# Patient Record
Sex: Female | Born: 1985 | Race: White | Hispanic: Yes | Marital: Married | Smoking: Current every day smoker
Health system: Southern US, Community
[De-identification: ages and names within clinical notes are randomized; demographics above are authoritative.]

## PROBLEM LIST (undated history)

## (undated) DIAGNOSIS — E069 Thyroiditis, unspecified: Secondary | ICD-10-CM

## (undated) DIAGNOSIS — F32A Depression, unspecified: Secondary | ICD-10-CM

## (undated) DIAGNOSIS — F419 Anxiety disorder, unspecified: Secondary | ICD-10-CM

## (undated) DIAGNOSIS — F1520 Other stimulant dependence, uncomplicated: Secondary | ICD-10-CM

## (undated) DIAGNOSIS — O99333 Smoking (tobacco) complicating pregnancy, third trimester: Secondary | ICD-10-CM

## (undated) DIAGNOSIS — R011 Cardiac murmur, unspecified: Secondary | ICD-10-CM

## (undated) DIAGNOSIS — F329 Major depressive disorder, single episode, unspecified: Secondary | ICD-10-CM

## (undated) DIAGNOSIS — D649 Anemia, unspecified: Secondary | ICD-10-CM

## (undated) DIAGNOSIS — E0590 Thyrotoxicosis, unspecified without thyrotoxic crisis or storm: Secondary | ICD-10-CM

## (undated) DIAGNOSIS — E049 Nontoxic goiter, unspecified: Secondary | ICD-10-CM

## (undated) DIAGNOSIS — E079 Disorder of thyroid, unspecified: Secondary | ICD-10-CM

## (undated) DIAGNOSIS — N200 Calculus of kidney: Secondary | ICD-10-CM

## (undated) DIAGNOSIS — E039 Hypothyroidism, unspecified: Secondary | ICD-10-CM

## (undated) HISTORY — DX: Calculus of kidney: N20.0

---

## 2005-01-05 DIAGNOSIS — G43109 Migraine with aura, not intractable, without status migrainosus: Secondary | ICD-10-CM | POA: Insufficient documentation

## 2005-01-05 HISTORY — DX: Migraine with aura, not intractable, without status migrainosus: G43.109

## 2005-01-20 ENCOUNTER — Emergency Department: Payer: Self-pay | Admitting: Internal Medicine

## 2005-02-17 ENCOUNTER — Emergency Department: Payer: Self-pay | Admitting: Internal Medicine

## 2005-02-18 ENCOUNTER — Emergency Department: Payer: Self-pay | Admitting: General Practice

## 2005-03-22 ENCOUNTER — Emergency Department: Payer: Self-pay | Admitting: Emergency Medicine

## 2005-06-20 ENCOUNTER — Emergency Department: Payer: Self-pay | Admitting: Emergency Medicine

## 2006-05-27 ENCOUNTER — Emergency Department: Payer: Self-pay | Admitting: Emergency Medicine

## 2006-05-28 ENCOUNTER — Ambulatory Visit: Payer: Self-pay | Admitting: Emergency Medicine

## 2006-07-01 ENCOUNTER — Inpatient Hospital Stay: Payer: Self-pay | Admitting: Unknown Physician Specialty

## 2006-09-29 ENCOUNTER — Emergency Department: Payer: Self-pay | Admitting: General Practice

## 2007-07-23 ENCOUNTER — Emergency Department: Payer: Self-pay | Admitting: Emergency Medicine

## 2007-11-24 ENCOUNTER — Observation Stay: Payer: Self-pay | Admitting: Obstetrics and Gynecology

## 2008-02-03 ENCOUNTER — Inpatient Hospital Stay: Payer: Self-pay | Admitting: Obstetrics & Gynecology

## 2008-07-30 ENCOUNTER — Emergency Department: Payer: Self-pay | Admitting: Emergency Medicine

## 2008-07-30 ENCOUNTER — Other Ambulatory Visit: Payer: Self-pay

## 2009-02-18 ENCOUNTER — Emergency Department: Payer: Self-pay | Admitting: Emergency Medicine

## 2009-02-23 ENCOUNTER — Emergency Department: Payer: Self-pay | Admitting: Emergency Medicine

## 2009-12-18 ENCOUNTER — Emergency Department: Payer: Self-pay | Admitting: Internal Medicine

## 2010-07-21 ENCOUNTER — Emergency Department: Payer: Self-pay | Admitting: Emergency Medicine

## 2012-03-05 ENCOUNTER — Emergency Department: Payer: Self-pay | Admitting: Emergency Medicine

## 2012-10-09 ENCOUNTER — Emergency Department: Payer: Self-pay | Admitting: Emergency Medicine

## 2012-10-09 LAB — URINALYSIS, COMPLETE
Glucose,UR: NEGATIVE mg/dL (ref 0–75)
Ketone: NEGATIVE
Nitrite: NEGATIVE
Protein: NEGATIVE
RBC,UR: 2 /HPF (ref 0–5)
Specific Gravity: 1.018 (ref 1.003–1.030)
Squamous Epithelial: 5
WBC UR: 1 /HPF (ref 0–5)

## 2013-07-26 ENCOUNTER — Emergency Department: Payer: Self-pay | Admitting: Emergency Medicine

## 2013-07-27 LAB — CBC
HGB: 16.1 g/dL — ABNORMAL HIGH (ref 12.0–16.0)
MCH: 33.8 pg (ref 26.0–34.0)
MCHC: 35.6 g/dL (ref 32.0–36.0)
MCV: 95 fL (ref 80–100)
Platelet: 195 10*3/uL (ref 150–440)
RDW: 13.1 % (ref 11.5–14.5)
WBC: 6.7 10*3/uL (ref 3.6–11.0)

## 2013-07-27 LAB — URINALYSIS, COMPLETE
Bilirubin,UR: NEGATIVE
Blood: NEGATIVE
Glucose,UR: NEGATIVE mg/dL (ref 0–75)
Nitrite: POSITIVE
Ph: 6 (ref 4.5–8.0)
RBC,UR: 1 /HPF (ref 0–5)
Specific Gravity: 1.005 (ref 1.003–1.030)
Squamous Epithelial: 1
WBC UR: 2 /HPF (ref 0–5)

## 2013-07-27 LAB — COMPREHENSIVE METABOLIC PANEL
Albumin: 4.4 g/dL (ref 3.4–5.0)
Anion Gap: 6 — ABNORMAL LOW (ref 7–16)
BUN: 7 mg/dL (ref 7–18)
Bilirubin,Total: 0.5 mg/dL (ref 0.2–1.0)
Calcium, Total: 8.6 mg/dL (ref 8.5–10.1)
Chloride: 107 mmol/L (ref 98–107)
Co2: 28 mmol/L (ref 21–32)
EGFR (African American): >60
EGFR (Non-African Amer.): >60
Glucose: 90 mg/dL (ref 65–99)
Potassium: 3.3 mmol/L — ABNORMAL LOW (ref 3.5–5.1)
SGOT(AST): 25 U/L (ref 15–37)
SGPT (ALT): 18 U/L (ref 12–78)
Total Protein: 8.3 g/dL — ABNORMAL HIGH (ref 6.4–8.2)

## 2013-07-27 LAB — DRUG SCREEN, URINE
Barbiturates, Ur Screen: NEGATIVE (ref ?–200)
Benzodiazepine, Ur Scrn: POSITIVE (ref ?–200)
Cannabinoid 50 Ng, Ur ~~LOC~~: NEGATIVE (ref ?–50)
Methadone, Ur Screen: NEGATIVE (ref ?–300)
Opiate, Ur Screen: NEGATIVE (ref ?–300)

## 2013-07-27 LAB — ETHANOL
Ethanol %: 0.179 % — ABNORMAL HIGH (ref 0.000–0.080)
Ethanol: 179 mg/dL

## 2013-07-27 LAB — SALICYLATE LEVEL: Salicylates, Serum: 3.3 mg/dL — ABNORMAL HIGH

## 2013-08-05 ENCOUNTER — Emergency Department: Payer: Self-pay | Admitting: Emergency Medicine

## 2013-08-05 LAB — DRUG SCREEN, URINE
Amphetamines, Ur Screen: NEGATIVE (ref ?–1000)
Barbiturates, Ur Screen: NEGATIVE (ref ?–200)
Benzodiazepine, Ur Scrn: POSITIVE (ref ?–200)
Cannabinoid 50 Ng, Ur ~~LOC~~: NEGATIVE (ref ?–50)
Cocaine Metabolite,Ur ~~LOC~~: NEGATIVE (ref ?–300)
Opiate, Ur Screen: NEGATIVE (ref ?–300)
Tricyclic, Ur Screen: NEGATIVE (ref ?–1000)

## 2013-08-05 LAB — COMPREHENSIVE METABOLIC PANEL
Albumin: 4.3 g/dL (ref 3.4–5.0)
Alkaline Phosphatase: 76 U/L (ref 50–136)
Anion Gap: 3 — ABNORMAL LOW (ref 7–16)
Calcium, Total: 8.6 mg/dL (ref 8.5–10.1)
Chloride: 110 mmol/L — ABNORMAL HIGH (ref 98–107)
Co2: 27 mmol/L (ref 21–32)
Creatinine: 0.65 mg/dL (ref 0.60–1.30)
EGFR (African American): >60
EGFR (Non-African Amer.): >60
Glucose: 91 mg/dL (ref 65–99)
Potassium: 3.6 mmol/L (ref 3.5–5.1)
SGOT(AST): 22 U/L (ref 15–37)
Sodium: 140 mmol/L (ref 136–145)
Total Protein: 7.9 g/dL (ref 6.4–8.2)

## 2013-08-05 LAB — URINALYSIS, COMPLETE
Bilirubin,UR: NEGATIVE
Glucose,UR: NEGATIVE mg/dL (ref 0–75)
Ph: 5 (ref 4.5–8.0)
Specific Gravity: 1.026 (ref 1.003–1.030)

## 2013-08-05 LAB — CBC
HGB: 16.3 g/dL — ABNORMAL HIGH (ref 12.0–16.0)
MCHC: 35.6 g/dL (ref 32.0–36.0)
Platelet: 157 10*3/uL (ref 150–440)
RBC: 4.84 10*6/uL (ref 3.80–5.20)
RDW: 13.2 % (ref 11.5–14.5)
WBC: 6.1 10*3/uL (ref 3.6–11.0)

## 2013-08-05 LAB — PREGNANCY, URINE: Pregnancy Test, Urine: NEGATIVE m[IU]/mL

## 2013-08-05 LAB — TSH: Thyroid Stimulating Horm: 0.28 u[IU]/mL — ABNORMAL LOW

## 2013-08-31 ENCOUNTER — Emergency Department: Payer: Self-pay | Admitting: Emergency Medicine

## 2014-11-26 ENCOUNTER — Emergency Department: Payer: Self-pay | Admitting: Emergency Medicine

## 2014-11-28 LAB — BETA STREP CULTURE(ARMC)

## 2015-09-07 DIAGNOSIS — F32A Depression, unspecified: Secondary | ICD-10-CM | POA: Insufficient documentation

## 2016-08-09 ENCOUNTER — Encounter: Payer: Self-pay | Admitting: *Deleted

## 2016-08-09 ENCOUNTER — Emergency Department
Admission: EM | Admit: 2016-08-09 | Discharge: 2016-08-09 | Disposition: A | Discharge status: Home / Self Care | Payer: Self-pay | Attending: Emergency Medicine | Admitting: Emergency Medicine

## 2016-08-09 DIAGNOSIS — J029 Acute pharyngitis, unspecified: Secondary | ICD-10-CM | POA: Insufficient documentation

## 2016-08-09 DIAGNOSIS — E0590 Thyrotoxicosis, unspecified without thyrotoxic crisis or storm: Secondary | ICD-10-CM | POA: Insufficient documentation

## 2016-08-09 LAB — COMPREHENSIVE METABOLIC PANEL
ALBUMIN: 3.8 g/dL (ref 3.5–5.0)
ALK PHOS: 72 U/L (ref 38–126)
ALT: 26 U/L (ref 14–54)
ANION GAP: 0 — AB (ref 5–15)
AST: 29 U/L (ref 15–41)
BILIRUBIN TOTAL: 1.1 mg/dL (ref 0.3–1.2)
BUN: 11 mg/dL (ref 6–20)
CALCIUM: 9.2 mg/dL (ref 8.9–10.3)
CO2: 27 mmol/L (ref 22–32)
Chloride: 110 mmol/L (ref 101–111)
GLUCOSE: 101 mg/dL — AB (ref 65–99)
Potassium: 3.8 mmol/L (ref 3.5–5.1)
SODIUM: 137 mmol/L (ref 135–145)
TOTAL PROTEIN: 6.7 g/dL (ref 6.5–8.1)

## 2016-08-09 LAB — CBC WITH DIFFERENTIAL/PLATELET
Basophils Absolute: 0 K/uL (ref 0–0.1)
Basophils Relative: 0 %
Eosinophils Absolute: 0 K/uL (ref 0–0.7)
Eosinophils Relative: 1 %
HCT: 38 % (ref 35.0–47.0)
Hemoglobin: 13.1 g/dL (ref 12.0–16.0)
Lymphocytes Relative: 30 %
Lymphs Abs: 1.6 K/uL (ref 1.0–3.6)
MCH: 28.6 pg (ref 26.0–34.0)
MCHC: 34.5 g/dL (ref 32.0–36.0)
MCV: 83 fL (ref 80.0–100.0)
Monocytes Absolute: 0.4 K/uL (ref 0.2–0.9)
Monocytes Relative: 8 %
Neutro Abs: 3.2 K/uL (ref 1.4–6.5)
Neutrophils Relative %: 61 %
Platelets: 153 K/uL (ref 150–440)
RBC: 4.57 MIL/uL (ref 3.80–5.20)
RDW: 12.7 % (ref 11.5–14.5)
WBC: 5.3 K/uL (ref 3.6–11.0)

## 2016-08-09 LAB — TSH: TSH: <0.01 u[IU]/mL — ABNORMAL LOW (ref 0.350–4.500)

## 2016-08-09 LAB — T4, FREE: FREE T4: 5.2 ng/dL — AB (ref 0.61–1.12)

## 2016-08-09 LAB — MONONUCLEOSIS SCREEN: Mono Screen: NEGATIVE

## 2016-08-09 MED ORDER — PROPRANOLOL HCL 10 MG PO TABS: 10.0000 mg | ORAL_TABLET | Freq: Three times a day (TID) | ORAL | 0 refills | Status: DC

## 2016-08-09 MED ORDER — METHIMAZOLE 5 MG PO TABS: 15.0000 mg | ORAL_TABLET | Freq: Three times a day (TID) | ORAL | 0 refills | Status: DC

## 2016-08-09 MED ORDER — LORAZEPAM 0.5 MG PO TABS
0.5000 mg | ORAL_TABLET | Freq: Once | ORAL | Status: AC
  Administered 2016-08-09: 0.5 mg via ORAL
  Filled 2016-08-09: qty 1

## 2016-08-09 MED ORDER — PROPRANOLOL HCL 10 MG PO TABS
10.0000 mg | ORAL_TABLET | Freq: Once | ORAL | Status: AC
  Administered 2016-08-09: 10 mg via ORAL
  Filled 2016-08-09: qty 1

## 2016-08-09 MED ORDER — SODIUM CHLORIDE 0.9 % IV SOLN
Freq: Once | INTRAVENOUS | Status: AC
Start: 2016-08-09 — End: 2016-08-09
  Administered 2016-08-09: 11:00:00 via INTRAVENOUS

## 2016-08-09 MED ORDER — NICOTINE POLACRILEX 2 MG MT GUM
2.0000 mg | CHEWING_GUM | OROMUCOSAL | Status: DC | PRN
  Administered 2016-08-09: 2 mg via ORAL
  Filled 2016-08-09: qty 1

## 2016-08-09 MED ORDER — KETOROLAC TROMETHAMINE 30 MG/ML IJ SOLN
30.0000 mg | Freq: Once | INTRAMUSCULAR | Status: AC
  Administered 2016-08-09: 30 mg via INTRAVENOUS
  Filled 2016-08-09: qty 1

## 2016-08-09 MED ORDER — DEXAMETHASONE SODIUM PHOSPHATE 10 MG/ML IJ SOLN
10.0000 mg | Freq: Once | INTRAMUSCULAR | Status: AC
  Administered 2016-08-09: 10 mg via INTRAVENOUS
  Filled 2016-08-09: qty 1

## 2016-08-09 MED ORDER — METHIMAZOLE 10 MG PO TABS
10.0000 mg | ORAL_TABLET | Freq: Once | ORAL | Status: AC
  Administered 2016-08-09: 10 mg via ORAL
  Filled 2016-08-09: qty 1

## 2016-08-09 MED ORDER — PREDNISONE 10 MG (21) PO TBPK: ORAL_TABLET | ORAL | 0 refills | Status: DC

## 2016-08-09 NOTE — ED Provider Notes (Signed)
[facility] Emergency Department Provider Note        Time seen: ----------------------------------------- 10:47 AM on 08/09/2016 -----------------------------------------    I have reviewed the triage vital signs and the nursing notes.   HISTORY  Chief Complaint Sore Throat    HPI Vanessa Rivera is a 30 y.o. female who presents to ER for persistent sore throat. Patient states she was seen at [facility] yesterday with a swollen throat and she left. Patient states she was called this morning and told her thyroid levels were abnormal. She complains of continued sore throat and pain with swallowing. She complains of pain in the back of her throat as well as pain in her neck. She denies fevers or chills, just complains of difficulty swallowing and neck pain.   History reviewed. No pertinent past medical history.  There are no active problems to display for this patient.   No past surgical history on file.  Allergies Penicillins  Social History Social History  Substance Use Topics  . Smoking status: Not on file  . Smokeless tobacco: Not on file  . Alcohol use Not on file    Review of Systems Constitutional: Negative for fever. ENT: Positive for sore throat, neck pain Cardiovascular: Negative for chest pain. Respiratory: Negative for shortness of breath. Gastrointestinal: Negative for abdominal pain, vomiting and diarrhea. Genitourinary: Negative for dysuria. Musculoskeletal: Negative for back pain. Skin: Negative for rash. Neurological: Negative for headaches, focal weakness or numbness.  10-point ROS otherwise negative.  ____________________________________________   PHYSICAL EXAM:  VITAL SIGNS: ED Triage Vitals [08/09/16 1003]  Enc Vitals Group     BP (!) 143/83     Pulse Rate (!) 120     Resp 18     Temp 98.5 F (36.9 C)     Temp Source Oral     SpO2 100 %     Weight 130 lb (59 kg)     Height 5\' 3"  (1.6 m)     Head  Circumference      Peak Flow      Pain Score 6     Pain Loc      Pain Edu?      Excl. in GC?     Constitutional: Alert and oriented. Well appearing and in no distress. Eyes: Conjunctivae are normal. PERRL. Normal extraocular movements. ENT   Head: Normocephalic and atraumatic.   Nose: No congestion/rhinnorhea.   Mouth/Throat: Posterior pharyngeal erythema, mild tonsillar hypertrophy   Neck: No stridor.Mild anterior cervical adenopathy, thyromegaly is noted. Cardiovascular: Normal rate, regular rhythm. No murmurs, rubs, or gallops. Respiratory: Normal respiratory effort without tachypnea nor retractions. Breath sounds are clear and equal bilaterally. No wheezes/rales/rhonchi. Gastrointestinal: Soft and nontender. Normal bowel sounds Musculoskeletal: Nontender with normal range of motion in all extremities. No lower extremity tenderness nor edema. Neurologic:  Normal speech and language. No gross focal neurologic deficits are appreciated.  Skin:  Skin is warm, dry and intact. No rash noted. Psychiatric: Mood and affect are normal. Speech and behavior are normal.  ____________________________________________  ED COURSE:  Pertinent labs & imaging results that were available during my care of the patient were reviewed by me and considered in my medical decision making (see chart for details). Clinical Course  Patient resents in no acute distress, we will assess for infectious etiology for sore throat as well as thyroid testing.  Procedures ____________________________________________   LABS (pertinent positives/negatives)  Labs Reviewed  COMPREHENSIVE METABOLIC PANEL - Abnormal; Notable for the following:  Result Value   Glucose, Bld 101 (*)    Creatinine, Ser <0.30 (*)    Anion gap 0 (*)    All other components within normal limits  TSH - Abnormal; Notable for the following:    TSH <0.010 (*)    All other components within normal limits  T4, FREE - Abnormal;  Notable for the following:    Free T4 5.20 (*)    All other components within normal limits  CULTURE, GROUP A STREP ([facility]RC)  CBC WITH DIFFERENTIAL/PLATELET  MONONUCLEOSIS SCREEN  THYROID STIMULATING IMMUNOGLOBULIN  ____________________________________________  FINAL ASSESSMENT AND PLAN  Hyperthyroidism, pharyngitis  Plan: Patient with labs and imaging as dictated above. Unclear etiology for her sore throat, likely viral. Her tests here been unremarkable. We'll place her on steroids to take as an outpatient. She is also found to be hyperthyroid, I discussed with endocrinology and she'll be placed on propranolol and methimazole. I will refer her to endocrinology for close outpatient follow-up.   Emily FilbertWilliams, Jonathan E, MD   Note: This dictation was prepared with Dragon dictation. Any transcriptional errors that result from this process are unintentional    Emily FilbertJonathan E Williams, MD 08/09/16 1310

## 2016-08-09 NOTE — ED Notes (Addendum)
POCT RAPID STREP TEST RESULT NEGATIVE

## 2016-08-09 NOTE — ED Notes (Signed)
Pt. Verbalizes understanding of d/c instructions, prescriptions, and follow-up. VS stable and pain controlled per pt.  Pt. In NAD at time of d/c and denies further concerns regarding this visit. Pt. Ambulatory out of unit with steady gait in good spirits. Pt advised to return to the ED at any time for emergent concerns, or for new/worsening symptoms.

## 2016-08-09 NOTE — ED Triage Notes (Signed)
Pt states she was seen at [facility] yesterday for swollen throat and left, states she was called this AM and told her thyroid levels were off, states throat swelling and pain, pt speaking in full clear sentences in no distress

## 2016-08-12 LAB — CULTURE, GROUP A STREP (THRC)

## 2016-08-16 LAB — THYROID STIMULATING IMMUNOGLOBULIN: THYROID STIMULATING IMMUNOGLOB: 543 % — AB (ref 0–139)

## 2016-10-11 ENCOUNTER — Emergency Department
Admission: EM | Admit: 2016-10-11 | Discharge: 2016-10-11 | Disposition: A | Discharge status: Home / Self Care | Payer: Self-pay | Attending: Emergency Medicine | Admitting: Emergency Medicine

## 2016-10-11 ENCOUNTER — Encounter: Payer: Self-pay | Admitting: Emergency Medicine

## 2016-10-11 DIAGNOSIS — F17200 Nicotine dependence, unspecified, uncomplicated: Secondary | ICD-10-CM | POA: Insufficient documentation

## 2016-10-11 DIAGNOSIS — Z79899 Other long term (current) drug therapy: Secondary | ICD-10-CM | POA: Insufficient documentation

## 2016-10-11 DIAGNOSIS — Z5321 Procedure and treatment not carried out due to patient leaving prior to being seen by health care provider: Secondary | ICD-10-CM | POA: Insufficient documentation

## 2016-10-11 DIAGNOSIS — R590 Localized enlarged lymph nodes: Secondary | ICD-10-CM | POA: Insufficient documentation

## 2016-10-11 MED ORDER — NAPROXEN 500 MG PO TABS: 500.0000 mg | ORAL_TABLET | Freq: Two times a day (BID) | ORAL | 0 refills | Status: DC

## 2016-10-11 NOTE — ED Triage Notes (Signed)
Pt with right sided neck swelling this AM, has been on flagyl for Trichomoniasis x3 days. Pt airway intact, no respiratory distress, normal voice, pt talking on cell phone.

## 2016-10-30 NOTE — ED Provider Notes (Signed)
[facility] Emergency Department Provider Note  ____________________________________________  Time seen: Approximately 8:18 AM  I have reviewed the triage vital signs and the nursing notes.   HISTORY  Chief Complaint Lymphadenopathy    HPI Vanessa Rivera is a 30 y.o. female who presents to the emergency department for evaluation of swelling on the right side of her neck. Symptoms started this morning. She is currently taking Flagyl, but has not otherwise taken any medications. She denies difficulty swallowing or breathing. She denies cough or sore throat.   History reviewed. No pertinent past medical history.  There are no active problems to display for this patient.   History reviewed. No pertinent surgical history.  Prior to Admission medications   Medication Sig Start Date End Date Taking? Authorizing Provider  methimazole (TAPAZOLE) 5 MG tablet Take 3 tablets (15 mg total) by mouth 3 (three) times daily. 08/09/16   Emily FilbertJonathan E Williams, MD  naproxen (NAPROSYN) 500 MG tablet Take 1 tablet (500 mg total) by mouth 2 (two) times daily with a meal. 10/11/16   Nakia Koble B Meosha Castanon, FNP  predniSONE (STERAPRED UNI-PAK 21 TAB) 10 MG (21) TBPK tablet Dispense steroid taper pack as directed 08/09/16   Emily FilbertJonathan E Williams, MD  propranolol (INDERAL) 10 MG tablet Take 1 tablet (10 mg total) by mouth 3 (three) times daily. 08/09/16   Emily FilbertJonathan E Williams, MD    Allergies Penicillins  No family history on file.  Social History Social History  Substance Use Topics  . Smoking status: Current Every Day Smoker  . Smokeless tobacco: Never Used  . Alcohol use No    Review of Systems Constitutional: Negative for fever. Eyes: No visual changes. ENT: Negataive for sore throat; negative for difficulty swallowing. Positive for right side lymph node swelling.  Respiratory: Denies shortness of breath. Gastrointestinal: No abdominal pain.  No nausea, no vomiting.  No  diarrhea.  Musculoskeletal: Negative for generalized body aches. Skin: Negative for rash. Neurological: Negative for headaches, focal weakness or numbness.  ____________________________________________   PHYSICAL EXAM:  VITAL SIGNS: ED Triage Vitals [10/11/16 0816]  Enc Vitals Group     BP (!) 148/76     Pulse Rate (!) 109     Resp 16     Temp 98.2 F (36.8 C)     Temp Source Oral     SpO2 99 %     Weight 130 lb (59 kg)     Height 5\' 3"  (1.6 m)     Head Circumference      Peak Flow      Pain Score 5     Pain Loc      Pain Edu?      Excl. in GC?    Constitutional: Alert and oriented. Well appearing and in no acute distress. Eyes: Conjunctivae are normal. PERRL. EOMI. Head: Atraumatic. Nose: No congestion/rhinnorhea. Mouth/Throat: Mucous membranes are moist.  Oropharynx not edematous, no tonsillar exudate. Uvula midline. Neck: No stridor.  Lymphatic: Lymphadenopathy: Positive, palpable node on the right anterior cervical Cardiovascular: Normal rate, regular rhythm. Good peripheral circulation. Respiratory: Normal respiratory effort. Lungs CTAB. Gastrointestinal: Soft and nontender. Musculoskeletal: No lower extremity tenderness nor edema.   Neurologic:  Normal speech and language. No gross focal neurologic deficits are appreciated. Speech is normal. No gait instability. Skin:  Skin is warm, dry and intact. No rash noted Psychiatric: Mood and affect are normal. Speech and behavior are normal.  ____________________________________________   LABS (all labs ordered are listed, but only  abnormal results are displayed)  Labs Reviewed - No data to display ____________________________________________  EKG  Not indicated ____________________________________________  RADIOLOGY  Not indicated. ____________________________________________   PROCEDURES  Procedure(s) performed: None  Critical Care performed:  No  ____________________________________________   INITIAL IMPRESSION / ASSESSMENT AND PLAN / ED COURSE  Clinical Course     Pertinent labs & imaging results that were available during my care of the patient were reviewed by me and considered in my medical decision making (see chart for details).  Patient encouraged to take the naproxen as prescribed. She is to continue her Flagyl as well. She is to follow up with the PCP for symptoms that do not improve over the next few days or return to the ER if unable to schedule an appointment. ____________________________________________   FINAL CLINICAL IMPRESSION(S) / ED DIAGNOSES  Final diagnoses:  Lymphadenopathy, cervical    Note:  This document was prepared using Dragon voice recognition software and may include unintentional dictation errors.    Chinita PesterCari B Kilo Eshelman, FNP 10/30/16 [id number]    Sharman CheekPhillip Stafford, MD 10/30/16 [phone]

## 2016-10-31 ENCOUNTER — Encounter: Payer: Self-pay | Admitting: Emergency Medicine

## 2016-10-31 ENCOUNTER — Emergency Department
Admission: EM | Admit: 2016-10-31 | Discharge: 2016-11-01 | Disposition: A | Discharge status: Home / Self Care | Payer: Self-pay | Attending: Emergency Medicine | Admitting: Emergency Medicine

## 2016-10-31 DIAGNOSIS — E061 Subacute thyroiditis: Secondary | ICD-10-CM | POA: Insufficient documentation

## 2016-10-31 DIAGNOSIS — Z791 Long term (current) use of non-steroidal anti-inflammatories (NSAID): Secondary | ICD-10-CM | POA: Insufficient documentation

## 2016-10-31 DIAGNOSIS — Z79899 Other long term (current) drug therapy: Secondary | ICD-10-CM | POA: Insufficient documentation

## 2016-10-31 DIAGNOSIS — F17200 Nicotine dependence, unspecified, uncomplicated: Secondary | ICD-10-CM | POA: Insufficient documentation

## 2016-10-31 DIAGNOSIS — E0590 Thyrotoxicosis, unspecified without thyrotoxic crisis or storm: Secondary | ICD-10-CM | POA: Insufficient documentation

## 2016-10-31 DIAGNOSIS — E049 Nontoxic goiter, unspecified: Secondary | ICD-10-CM | POA: Insufficient documentation

## 2016-10-31 LAB — CBC
HCT: 37.6 % (ref 35.0–47.0)
Hemoglobin: 12.9 g/dL (ref 12.0–16.0)
MCH: 28.5 pg (ref 26.0–34.0)
MCHC: 34.4 g/dL (ref 32.0–36.0)
MCV: 82.7 fL (ref 80.0–100.0)
PLATELETS: 144 10*3/uL — AB (ref 150–440)
RBC: 4.54 MIL/uL (ref 3.80–5.20)
RDW: 12.7 % (ref 11.5–14.5)
WBC: 4.5 10*3/uL (ref 3.6–11.0)

## 2016-10-31 MED ORDER — SODIUM CHLORIDE 0.9 % IV BOLUS (SEPSIS)
1000.0000 mL | Freq: Once | INTRAVENOUS | Status: AC
  Administered 2016-10-31: 1000 mL via INTRAVENOUS

## 2016-10-31 NOTE — ED Notes (Signed)
Pt reports she has not taken thyroid med for 2 months due to no money for insurance/medicines.

## 2016-10-31 NOTE — ED Notes (Signed)
Pt has swelling to neck.  Pt has hyperthyroidism and is taking meds.  Pt noticed swelling today.  States neck is tender to touch.  No diff swallowing.  No resp distress.  Pt alert.

## 2016-10-31 NOTE — ED Triage Notes (Signed)
Patient ambulatory to triage with steady gait, without difficulty or distress noted; pt recently seen here & rx tapazole for thyroid but c/o persistent pain; has appt 11/22 for f/u care

## 2016-10-31 NOTE — ED Notes (Signed)
Iv started , fluids infusing

## 2016-11-01 ENCOUNTER — Emergency Department: Payer: Self-pay

## 2016-11-01 LAB — BASIC METABOLIC PANEL
ANION GAP: 5 (ref 5–15)
BUN: 10 mg/dL (ref 6–20)
CHLORIDE: 109 mmol/L (ref 101–111)
CO2: 28 mmol/L (ref 22–32)
Calcium: 9.3 mg/dL (ref 8.9–10.3)
Creatinine, Ser: 0.4 mg/dL — ABNORMAL LOW (ref 0.44–1.00)
GFR calc Af Amer: >60 mL/min (ref >60)
GLUCOSE: 108 mg/dL — AB (ref 65–99)
POTASSIUM: 3.2 mmol/L — AB (ref 3.5–5.1)
Sodium: 142 mmol/L (ref 135–145)

## 2016-11-01 LAB — TSH: TSH: <0.01 u[IU]/mL — ABNORMAL LOW (ref 0.350–4.500)

## 2016-11-01 LAB — T4, FREE: Free T4: 5.35 ng/dL — ABNORMAL HIGH (ref 0.61–1.12)

## 2016-11-01 LAB — POCT PREGNANCY, URINE: PREG TEST UR: NEGATIVE

## 2016-11-01 MED ORDER — IOPAMIDOL (ISOVUE-300) INJECTION 61%
75.0000 mL | Freq: Once | INTRAVENOUS | Status: AC | PRN
  Administered 2016-11-01: 75 mL via INTRAVENOUS

## 2016-11-01 MED ORDER — PREDNISONE 20 MG PO TABS: 40.0000 mg | ORAL_TABLET | Freq: Every day | ORAL | 0 refills | Status: DC

## 2016-11-01 MED ORDER — TRAMADOL HCL 50 MG PO TABS
50.0000 mg | ORAL_TABLET | Freq: Once | ORAL | Status: AC
  Administered 2016-11-01: 50 mg via ORAL
  Filled 2016-11-01: qty 1

## 2016-11-01 MED ORDER — TRAMADOL HCL 50 MG PO TABS: 50.0000 mg | ORAL_TABLET | Freq: Four times a day (QID) | ORAL | 0 refills | Status: DC | PRN

## 2016-11-01 NOTE — Discharge Instructions (Signed)
Please continue to take your medicine and follow up with endocrinology

## 2016-11-01 NOTE — ED Provider Notes (Signed)
[facility] Emergency Department Provider Note   ____________________________________________   First MD Initiated Contact with Patient 10/31/16 2309     (approximate)  I have reviewed the triage vital signs and the nursing notes.   HISTORY  Chief Complaint No chief complaint on file.    HPI Vanessa Rivera is a 30 y.o. female who comes into the hospital today with neck swelling. She reports that she noticed it today. She was given some thyroid medicine 3-4 months ago as she had swelling before. She reports that she took her medication initially but has not taken it since. She reports that she has an appointment on the 22nd in [place] to talk about what to do with the swelling. She reports though that it had been improved. The patient reports that her boyfriend brought her her prescription and she took 3 pills of it today. She came into the hospital because her throat is tender and swollen and it hurts to swallow. The patient reports that she's had occasional shortness of breath on walking up the stairs and feels like her throat may be closing. The patient rates her pain a 7 out of 10 in intensity. She has been taking ibuprofen at home.   History reviewed. No pertinent past medical history.  There are no active problems to display for this patient.   History reviewed. No pertinent surgical history.  Prior to Admission medications   Medication Sig Start Date End Date Taking? Authorizing Provider  methimazole (TAPAZOLE) 5 MG tablet Take 3 tablets (15 mg total) by mouth 3 (three) times daily. 08/09/16  Yes Emily FilbertJonathan E Williams, MD  naproxen (NAPROSYN) 500 MG tablet Take 1 tablet (500 mg total) by mouth 2 (two) times daily with a meal. 10/11/16  Yes Cari B Triplett, FNP  propranolol (INDERAL) 10 MG tablet Take 1 tablet (10 mg total) by mouth 3 (three) times daily. 08/09/16  Yes Emily FilbertJonathan E Williams, MD  predniSONE (DELTASONE) 20 MG tablet Take 2 tablets (40  mg total) by mouth daily. 11/01/16   Rebecka ApleyAllison P Gerline Ratto, MD  traMADol (ULTRAM) 50 MG tablet Take 1 tablet (50 mg total) by mouth every 6 (six) hours as needed. 11/01/16   Rebecka ApleyAllison P Arnola Crittendon, MD    Allergies Penicillins  No family history on file.  Social History Social History  Substance Use Topics  . Smoking status: Current Every Day Smoker  . Smokeless tobacco: Never Used  . Alcohol use No    Review of Systems Constitutional: No fever/chills Eyes: No visual changes. ENT: Throat swelling. Cardiovascular: Denies chest pain. Respiratory:  shortness of breath. Gastrointestinal: No abdominal pain.  No nausea, no vomiting.  No diarrhea.  No constipation. Genitourinary: Negative for dysuria. Musculoskeletal: Negative for back pain. Skin: Negative for rash. Neurological: Negative for headaches, focal weakness or numbness.  10-point ROS otherwise negative.  ____________________________________________   PHYSICAL EXAM:  VITAL SIGNS: ED Triage Vitals  Enc Vitals Group     BP 10/31/16 2136 (!) 150/81     Pulse Rate 11/01/16 0002 86     Resp 10/31/16 2136 20     Temp 10/31/16 2136 98.4 F (36.9 C)     Temp Source 10/31/16 2136 Oral     SpO2 10/31/16 2136 99 %     Weight 10/31/16 2135 130 lb (59 kg)     Height 10/31/16 2135 5\' 3"  (1.6 m)     Head Circumference --      Peak Flow --  Pain Score 10/31/16 2135 6     Pain Loc --      Pain Edu? --      Excl. in GC? --     Constitutional: Alert and oriented. Well appearing and in mild distress. Eyes: Conjunctivae are normal. PERRL. EOMI. Head: Atraumatic. Nose: No congestion/rhinnorhea. Mouth/Throat: Mucous membranes are moist.  Oropharynx non-erythematous. Thyroid swelling and tenderness to palpation Cardiovascular: Normal rate, regular rhythm. Grossly normal heart sounds.  Good peripheral circulation. Respiratory: Normal respiratory effort.  No retractions. Lungs CTAB. Gastrointestinal: Soft and nontender. No  distention. Positive bowel sounds Musculoskeletal: No lower extremity tenderness nor edema.   Neurologic:  Normal speech and language.  Skin:  Skin is warm, dry and intact.  Psychiatric: Mood and affect are normal.   ____________________________________________   LABS (all labs ordered are listed, but only abnormal results are displayed)  Labs Reviewed  CBC - Abnormal; Notable for the following:       Result Value   Platelets 144 (*)    All other components within normal limits  BASIC METABOLIC PANEL - Abnormal; Notable for the following:    Potassium 3.2 (*)    Glucose, Bld 108 (*)    Creatinine, Ser 0.40 (*)    All other components within normal limits  TSH - Abnormal; Notable for the following:    TSH <0.010 (*)    All other components within normal limits  T4, FREE - Abnormal; Notable for the following:    Free T4 5.35 (*)    All other components within normal limits  POC URINE PREG, ED  POCT PREGNANCY, URINE   ____________________________________________  EKG  none ____________________________________________  RADIOLOGY  CT soft tissue neck ____________________________________________   PROCEDURES  Procedure(s) performed: None  Procedures  Critical Care performed: No  ____________________________________________   INITIAL IMPRESSION / ASSESSMENT AND PLAN / ED COURSE  Pertinent labs & imaging results that were available during my care of the patient were reviewed by me and considered in my medical decision making (see chart for details).  Is a 30 year old female who comes into the hospital today with some neck swelling. The patient was diagnosed with a goiter and hyperthyroidism many months ago. She reports though that she has not been taking her medication. Given the symptoms of difficulty swallowing I will perform a CT of her neck looking for obstruction. I will also give the patient dose of tramadol. I will give the patient liter of normal saline and  she will be reassessed.  Clinical Course as of Nov 01 310  Wed Nov 01, 2016  0205 1. Diffusely enlarged and homogeneous thyroid, which likely accounts for patient's anterior neck swelling. Correlation with thyroid function tests recommended. 2. Increased number of lymph nodes within the bilateral neck as well as the visualized upper mediastinum and partially visualized axillae. Findings are nonspecific, and may be reactive in nature (either to an infectious or inflammatory process). Possible lymphoproliferative disorder could also have this appearance. 3. Bilateral palatine and lingual tonsillar hypertrophy with hypertrophy of the adenoidal soft tissues. Similarly, findings may be reactive in nature, either due to an infectious or inflammatory process. Again, lymphoproliferative disorder could be considered. 4. Soft tissue density within the anterior mediastinum, likely residual thymic tissue. Differential considerations remain the same.   CT Soft Tissue Neck W Contrast [AW]    Clinical Course User Index [AW] Rebecka Apley, MD   The patient's CT shows an enlarged thyroid and enlarged lymph nodes. Her TSH is 0.010 and  her free T4 is elevated. I feel that the patient has some thyroiditis. At this time she is not tachycardic nor is she hypertensive. She does have a prescription for methimazole and an appointment with endocrinology. I will discharge the patient with some prednisone as well as tramadol for pain. She does need to follow-up for further evaluation of her thyroid.  ____________________________________________   FINAL CLINICAL IMPRESSION(S) / ED DIAGNOSES  Final diagnoses:  Hyperthyroidism  Goiter  Subacute thyroiditis      NEW MEDICATIONS STARTED DURING THIS VISIT:  Discharge Medication List as of 11/01/2016  2:16 AM    START taking these medications   Details  predniSONE (DELTASONE) 20 MG tablet Take 2 tablets (40 mg total) by mouth daily., Starting Wed  11/01/2016, Print    traMADol (ULTRAM) 50 MG tablet Take 1 tablet (50 mg total) by mouth every 6 (six) hours as needed., Starting Wed 11/01/2016, Print         Note:  This document was prepared using Dragon voice recognition software and may include unintentional dictation errors.    Rebecka ApleyAllison P Kyanne Rials, MD 11/01/16 [phone]

## 2016-11-26 ENCOUNTER — Emergency Department
Admission: EM | Admit: 2016-11-26 | Discharge: 2016-11-26 | Disposition: A | Discharge status: Home / Self Care | Payer: Self-pay | Attending: Emergency Medicine | Admitting: Emergency Medicine

## 2016-11-26 DIAGNOSIS — M545 Low back pain, unspecified: Secondary | ICD-10-CM

## 2016-11-26 DIAGNOSIS — Z79899 Other long term (current) drug therapy: Secondary | ICD-10-CM | POA: Insufficient documentation

## 2016-11-26 DIAGNOSIS — F17200 Nicotine dependence, unspecified, uncomplicated: Secondary | ICD-10-CM | POA: Insufficient documentation

## 2016-11-26 LAB — URINALYSIS, COMPLETE (UACMP) WITH MICROSCOPIC
BACTERIA UA: NONE SEEN
Bilirubin Urine: NEGATIVE
GLUCOSE, UA: NEGATIVE mg/dL
Ketones, ur: NEGATIVE mg/dL
Leukocytes, UA: NEGATIVE
NITRITE: NEGATIVE
PROTEIN: NEGATIVE mg/dL
SPECIFIC GRAVITY, URINE: 1.012 (ref 1.005–1.030)
pH: 6 (ref 5.0–8.0)

## 2016-11-26 LAB — BASIC METABOLIC PANEL
ANION GAP: 5 (ref 5–15)
BUN: 10 mg/dL (ref 6–20)
CHLORIDE: 103 mmol/L (ref 101–111)
CO2: 29 mmol/L (ref 22–32)
Calcium: 9 mg/dL (ref 8.9–10.3)
Creatinine, Ser: 0.57 mg/dL (ref 0.44–1.00)
GFR calc Af Amer: >60 mL/min (ref >60)
Glucose, Bld: 128 mg/dL — ABNORMAL HIGH (ref 65–99)
POTASSIUM: 3.5 mmol/L (ref 3.5–5.1)
SODIUM: 137 mmol/L (ref 135–145)

## 2016-11-26 LAB — CBC
HEMATOCRIT: 43.4 % (ref 35.0–47.0)
HEMOGLOBIN: 15 g/dL (ref 12.0–16.0)
MCH: 28.9 pg (ref 26.0–34.0)
MCHC: 34.5 g/dL (ref 32.0–36.0)
MCV: 83.6 fL (ref 80.0–100.0)
Platelets: 193 10*3/uL (ref 150–440)
RBC: 5.19 MIL/uL (ref 3.80–5.20)
RDW: 13.6 % (ref 11.5–14.5)
WBC: 4.8 10*3/uL (ref 3.6–11.0)

## 2016-11-26 LAB — POCT PREGNANCY, URINE: Preg Test, Ur: NEGATIVE

## 2016-11-26 MED ORDER — KETOROLAC TROMETHAMINE 10 MG PO TABS
10.0000 mg | ORAL_TABLET | Freq: Three times a day (TID) | ORAL | 0 refills | Status: DC | PRN
Start: 2016-11-26 — End: 2017-10-04

## 2016-11-26 NOTE — ED Provider Notes (Signed)
[facility] Emergency Department Provider Note  ____________________________________________  Time seen: Approximately 2:55 PM  I have reviewed the triage vital signs and the nursing notes.   HISTORY  Chief Complaint Flank Pain    HPI Vanessa Rivera is a 30 y.o. female , nonpregnant, without history of renal colic, presenting with left back pain.The patient reports that for the past 3-4 days, she has had an intermittent back pain that is worse with positional changes her laying on it. It feels better if she leans over on her left side. She's had no associated dysuria, hematuria, fever, nausea vomiting or diarrhea. No trauma or unusual exertion.   History reviewed. No pertinent past medical history.  There are no active problems to display for this patient.   History reviewed. No pertinent surgical history.  Current Outpatient Rx  . Order #: [id number] Class: Print  . Order #: [id number] Class: Print  . Order #: [id number] Class: Print  . Order #: [id number] Class: Print    Allergies Penicillins  No family history on file.  Social History Social History  Substance Use Topics  . Smoking status: Current Every Day Smoker  . Smokeless tobacco: Never Used  . Alcohol use No    Review of Systems Constitutional: No fever/chills.No lightheadedness or syncope. Eyes: No visual changes. ENT: No sore throat. No congestion or rhinorrhea. Cardiovascular: Denies chest pain. Denies palpitations. Respiratory: Denies shortness of breath.  No cough. Gastrointestinal: No abdominal pain.  No nausea, no vomiting.  No diarrhea.  No constipation. Genitourinary: Negative for dysuria. Musculoskeletal: Positive left-sided back pain that does not radiate. Skin: Negative for rash. Neurological: Negative for headaches. No focal numbness, tingling or weakness. No urinary or fecal incontinence or retention. No saddle anesthesia.  10-point ROS otherwise  negative.  ____________________________________________   PHYSICAL EXAM:  VITAL SIGNS: ED Triage Vitals  Enc Vitals Group     BP 11/26/16 1124 127/77     Pulse Rate 11/26/16 1124 88     Resp 11/26/16 1124 17     Temp 11/26/16 1124 98.2 F (36.8 C)     Temp Source 11/26/16 1124 Oral     SpO2 11/26/16 1124 100 %     Weight 11/26/16 1124 130 lb (59 kg)     Height 11/26/16 1124 5\' 3"  (1.6 m)     Head Circumference --      Peak Flow --      Pain Score 11/26/16 1125 6     Pain Loc --      Pain Edu? --      Excl. in GC? --     Constitutional: Alert and oriented. Well appearing and in no acute distress. Answers questions appropriately. Eyes: Conjunctivae are normal.  EOMI. No scleral icterus.No eye discharge. Head: Atraumatic. Nose: No congestion/rhinnorhea. Mouth/Throat: Mucous membranes are moist.  Neck: No stridor.  Supple.  No meningismus. Full range of motion without pain. No midline C-spine tenderness to palpation, step-offs or deformities. Cardiovascular: Normal rate, regular rhythm. No murmurs, rubs or gallops.  Respiratory: Normal respiratory effort.  No accessory muscle use or retractions. Lungs CTAB.  No wheezes, rales or ronchi. Gastrointestinal: Soft, nontender and nondistended.  No guarding or rebound.  No peritoneal signs. Musculoskeletal: No LE edema. No midline thoracic or lumbar spine tenderness palpation. Reproducible tenderness to palpation diffusely on the left back laterally. Neurologic:  A&Ox3.  Speech is clear.  Face and smile are symmetric.  EOMI.  Moves all extremities well. Normal gait without ataxia. Normal sensation to  light touch in the lower extremities. Skin:  Skin is warm, dry and intact. No rash noted. Psychiatric: Mood and affect are normal. Speech and behavior are normal.  Normal judgement  ____________________________________________   LABS (all labs ordered are listed, but only abnormal results are displayed)  Labs Reviewed  URINALYSIS,  COMPLETE (UACMP) WITH MICROSCOPIC - Abnormal; Notable for the following:       Result Value   Color, Urine YELLOW (*)    APPearance CLEAR (*)    Hgb urine dipstick SMALL (*)    Squamous Epithelial / LPF 0-5 (*)    All other components within normal limits  BASIC METABOLIC PANEL - Abnormal; Notable for the following:    Glucose, Bld 128 (*)    All other components within normal limits  CBC  POC URINE PREG, ED  POCT PREGNANCY, URINE   ____________________________________________  EKG  Not indicated ____________________________________________  RADIOLOGY  No results found.  ____________________________________________   PROCEDURES  Procedure(s) performed: None  Procedures  Critical Care performed: No ____________________________________________   INITIAL IMPRESSION / ASSESSMENT AND PLAN / ED COURSE  Pertinent labs & imaging results that were available during my care of the patient were reviewed by me and considered in my medical decision making (see chart for details).  30 y.o. female presenting with 3-4 days of intermittent left-sided back pain. Overall, the patient has a reassuring examination which is most consistent with musculoskeletal pain. She has positive hemoglobin in her urine dip but 0-5 red blood cells on the microanalysis; it is much less likely that she has renal colic although I did give her return precautions for this. Her creatinine is normal white blood cell is normal as well. She does not have a UTI. Plan discharge with symptomatic treatment, close PMD follow-up. The patient understands return precautions.  ____________________________________________  FINAL CLINICAL IMPRESSION(S) / ED DIAGNOSES  Final diagnoses:  Acute left-sided low back pain without sciatica    Clinical Course       NEW MEDICATIONS STARTED DURING THIS VISIT:  New Prescriptions   KETOROLAC (TORADOL) 10 MG TABLET    Take 1 tablet (10 mg total) by mouth every 8 (eight)  hours as needed for moderate pain (with food).      Rockne MenghiniAnne-Caroline Aldean Pipe, MD 11/26/16 1459

## 2016-11-26 NOTE — Discharge Instructions (Signed)
You may take Tylenol or Toradol for your pain. Please take Toradol with food. Do not take any other NSAID medications including Motrin, Aleve, ibuprofen, or Advil when you're taking Toradol.  May apply ice or heat to your back for 10 minutes every 2 hours to decrease pain.  Turn to the emergency department if you develop severe pain, blood in your urine, fever, vomiting, or any other symptoms concerning to you.

## 2016-11-26 NOTE — ED Triage Notes (Signed)
Pt c/o left flank pain for the past 3 days with nausea. Denies vomiting or hx of kidney stones in the past, denies painful urination

## 2016-11-26 NOTE — ED Notes (Signed)
Pt placed in room 11. Labs obtained in triage and completed. Pt states she has mid and lower back pain. Denies injury - pain is 6/10. Has not taken any meds for pain today.

## 2017-05-04 ENCOUNTER — Emergency Department
Admission: EM | Admit: 2017-05-04 | Discharge: 2017-05-04 | Disposition: A | Discharge status: Home / Self Care | Payer: Self-pay | Attending: Emergency Medicine | Admitting: Emergency Medicine

## 2017-05-04 ENCOUNTER — Encounter: Payer: Self-pay | Admitting: Emergency Medicine

## 2017-05-04 ENCOUNTER — Emergency Department: Payer: Self-pay

## 2017-05-04 DIAGNOSIS — F17200 Nicotine dependence, unspecified, uncomplicated: Secondary | ICD-10-CM | POA: Insufficient documentation

## 2017-05-04 DIAGNOSIS — Z79899 Other long term (current) drug therapy: Secondary | ICD-10-CM | POA: Insufficient documentation

## 2017-05-04 DIAGNOSIS — R609 Edema, unspecified: Secondary | ICD-10-CM

## 2017-05-04 DIAGNOSIS — E0590 Thyrotoxicosis, unspecified without thyrotoxic crisis or storm: Secondary | ICD-10-CM | POA: Insufficient documentation

## 2017-05-04 DIAGNOSIS — R600 Localized edema: Secondary | ICD-10-CM | POA: Insufficient documentation

## 2017-05-04 HISTORY — DX: Disorder of thyroid, unspecified: E07.9

## 2017-05-04 LAB — URINALYSIS, COMPLETE (UACMP) WITH MICROSCOPIC
BILIRUBIN URINE: NEGATIVE
Glucose, UA: NEGATIVE mg/dL
Hgb urine dipstick: NEGATIVE
KETONES UR: NEGATIVE mg/dL
LEUKOCYTES UA: NEGATIVE
NITRITE: NEGATIVE
PROTEIN: NEGATIVE mg/dL
Specific Gravity, Urine: 1.006 (ref 1.005–1.030)
WBC UA: NONE SEEN WBC/hpf (ref 0–5)
pH: 7 (ref 5.0–8.0)

## 2017-05-04 LAB — COMPREHENSIVE METABOLIC PANEL
ALBUMIN: 3.6 g/dL (ref 3.5–5.0)
ALK PHOS: 73 U/L (ref 38–126)
ALT: 13 U/L — ABNORMAL LOW (ref 14–54)
ANION GAP: 5 (ref 5–15)
AST: 22 U/L (ref 15–41)
BUN: 9 mg/dL (ref 6–20)
CALCIUM: 8.9 mg/dL (ref 8.9–10.3)
CHLORIDE: 107 mmol/L (ref 101–111)
CO2: 26 mmol/L (ref 22–32)
Creatinine, Ser: 0.38 mg/dL — ABNORMAL LOW (ref 0.44–1.00)
GFR calc non Af Amer: >60 mL/min (ref >60)
GLUCOSE: 88 mg/dL (ref 65–99)
Potassium: 3.6 mmol/L (ref 3.5–5.1)
SODIUM: 138 mmol/L (ref 135–145)
Total Bilirubin: 0.4 mg/dL (ref 0.3–1.2)
Total Protein: 6.2 g/dL — ABNORMAL LOW (ref 6.5–8.1)

## 2017-05-04 LAB — CBC WITH DIFFERENTIAL/PLATELET
BASOS PCT: 1 %
Basophils Absolute: 0 10*3/uL (ref 0–0.1)
EOS ABS: 0.1 10*3/uL (ref 0–0.7)
EOS PCT: 1 %
HCT: 33.7 % — ABNORMAL LOW (ref 35.0–47.0)
HEMOGLOBIN: 11.7 g/dL — AB (ref 12.0–16.0)
Lymphocytes Relative: 49 %
Lymphs Abs: 2.3 10*3/uL (ref 1.0–3.6)
MCH: 29.8 pg (ref 26.0–34.0)
MCHC: 34.8 g/dL (ref 32.0–36.0)
MCV: 85.6 fL (ref 80.0–100.0)
Monocytes Absolute: 0.3 10*3/uL (ref 0.2–0.9)
Monocytes Relative: 7 %
NEUTROS PCT: 42 %
Neutro Abs: 2 10*3/uL (ref 1.4–6.5)
PLATELETS: 159 10*3/uL (ref 150–440)
RBC: 3.94 MIL/uL (ref 3.80–5.20)
RDW: 13.2 % (ref 11.5–14.5)
WBC: 4.8 10*3/uL (ref 3.6–11.0)

## 2017-05-04 LAB — PREGNANCY, URINE: PREG TEST UR: NEGATIVE

## 2017-05-04 LAB — T4, FREE: FREE T4: 3.03 ng/dL — AB (ref 0.61–1.12)

## 2017-05-04 LAB — TSH: TSH: 0.015 u[IU]/mL — AB (ref 0.350–4.500)

## 2017-05-04 MED ORDER — IBUPROFEN 800 MG PO TABS
ORAL_TABLET | ORAL | Status: AC
  Administered 2017-05-04: 800 mg via ORAL
  Filled 2017-05-04: qty 1

## 2017-05-04 MED ORDER — METHIMAZOLE 5 MG PO TABS
5.0000 mg | ORAL_TABLET | Freq: Once | ORAL | Status: AC
  Administered 2017-05-04: 5 mg via ORAL
  Filled 2017-05-04: qty 1

## 2017-05-04 MED ORDER — PROPRANOLOL HCL 10 MG PO TABS
10.0000 mg | ORAL_TABLET | Freq: Once | ORAL | Status: AC
  Administered 2017-05-04: 10 mg via ORAL
  Filled 2017-05-04: qty 1

## 2017-05-04 MED ORDER — OXYCODONE-ACETAMINOPHEN 5-325 MG PO TABS
ORAL_TABLET | ORAL | Status: AC
  Administered 2017-05-04: 2 via ORAL
  Filled 2017-05-04: qty 2

## 2017-05-04 MED ORDER — TRAMADOL HCL 50 MG PO TABS: 50.0000 mg | ORAL_TABLET | Freq: Four times a day (QID) | ORAL | 0 refills | Status: DC | PRN

## 2017-05-04 MED ORDER — OXYCODONE-ACETAMINOPHEN 5-325 MG PO TABS
2.0000 | ORAL_TABLET | Freq: Once | ORAL | Status: AC
  Administered 2017-05-04: 2 via ORAL

## 2017-05-04 MED ORDER — METHIMAZOLE 5 MG PO TABS: 5.0000 mg | ORAL_TABLET | Freq: Three times a day (TID) | ORAL | 1 refills | Status: DC

## 2017-05-04 MED ORDER — IBUPROFEN 800 MG PO TABS
800.0000 mg | ORAL_TABLET | Freq: Once | ORAL | Status: AC
  Administered 2017-05-04: 800 mg via ORAL

## 2017-05-04 MED ORDER — PROPRANOLOL HCL 10 MG PO TABS: 10.0000 mg | ORAL_TABLET | Freq: Three times a day (TID) | ORAL | 1 refills | Status: DC

## 2017-05-04 NOTE — ED Provider Notes (Signed)
[facility] Emergency Department Provider Note       Time seen: ----------------------------------------- 6:59 PM on 05/04/2017 -----------------------------------------     I have reviewed the triage vital signs and the nursing notes.   HISTORY   Chief Complaint Leg Swelling    HPI Vanessa Rivera is a 31 y.o. female who presents to the ED for bilateral ankle swelling and pain that started yesterday. Patient states she is also having pain in left ankle. She denies any recent injury or heavy physical activity. She denies fevers, chills, chest pain, shortness of breath, vomiting or diarrhea. She denies any skin discoloration also.   Past Medical History:  Diagnosis Date  . Thyroid disease     There are no active problems to display for this patient.   History reviewed. No pertinent surgical history.  Allergies Penicillins  Social History Social History  Substance Use Topics  . Smoking status: Current Every Day Smoker  . Smokeless tobacco: Never Used  . Alcohol use No    Review of Systems Constitutional: Negative for fever. Eyes: Negative for vision changes ENT:  Negative for congestion, sore throat Cardiovascular: Negative for chest pain. Respiratory: Negative for shortness of breath. Gastrointestinal: Negative for abdominal pain, vomiting and diarrhea. Genitourinary: Negative for dysuria. Musculoskeletal: Positive for bilateral leg pain Skin: Negative for rash. Neurological: Negative for headaches, focal weakness or numbness.  All systems negative/normal/unremarkable except as stated in the HPI  ____________________________________________   PHYSICAL EXAM:  VITAL SIGNS: ED Triage Vitals  Enc Vitals Group     BP 05/04/17 1652 123/70     Pulse Rate 05/04/17 1652 (!) 101     Resp 05/04/17 1652 16     Temp 05/04/17 1652 98.7 F (37.1 C)     Temp Source 05/04/17 1652 Oral     SpO2 05/04/17 1652 99 %     Weight 05/04/17  1652 130 lb (59 kg)     Height --      Head Circumference --      Peak Flow --      Pain Score 05/04/17 1651 7     Pain Loc --      Pain Edu? --      Excl. in GC? --     Constitutional: Alert and oriented. Well appearing and in no distress. Eyes: Conjunctivae are normal. PERRL. Normal extraocular movements. ENT   Head: Normocephalic and atraumatic.   Nose: No congestion/rhinnorhea.   Mouth/Throat: Mucous membranes are moist.   Neck: No stridor. Cardiovascular: Normal rate, regular rhythm. No murmurs, rubs, or gallops. Respiratory: Normal respiratory effort without tachypnea nor retractions. Breath sounds are clear and equal bilaterally. No wheezes/rales/rhonchi. Gastrointestinal: Soft and nontender. Normal bowel sounds Musculoskeletal: Nontender with normal range of motion in extremities. Mild edema on the lower extremities with nonspecific tenderness, worse in the right leg Neurologic:  Normal speech and language. No gross focal neurologic deficits are appreciated.  Skin:  Skin is warm, dry and intact. No rash noted. Psychiatric: Mood and affect are normal. Speech and behavior are normal.  ____________________________________________  ED COURSE:  Pertinent labs & imaging results that were available during my care of the patient were reviewed by me and considered in my medical decision making (see chart for details). Patient presents for leg pain of uncertain etiology, we will assess with labs and imaging as indicated.   Procedures ____________________________________________   LABS (pertinent positives/negatives)  Labs Reviewed  CBC WITH DIFFERENTIAL/PLATELET - Abnormal; Notable for the following:  Result Value   Hemoglobin 11.7 (*)    HCT 33.7 (*)    All other components within normal limits  COMPREHENSIVE METABOLIC PANEL - Abnormal; Notable for the following:    Creatinine, Ser 0.38 (*)    Total Protein 6.2 (*)    ALT 13 (*)    All other components  within normal limits  URINALYSIS, COMPLETE (UACMP) WITH MICROSCOPIC - Abnormal; Notable for the following:    Color, Urine YELLOW (*)    APPearance CLEAR (*)    Bacteria, UA RARE (*)    Squamous Epithelial / LPF 0-5 (*)    All other components within normal limits  TSH - Abnormal; Notable for the following:    TSH 0.015 (*)    All other components within normal limits  T4, FREE - Abnormal; Notable for the following:    Free T4 3.03 (*)    All other components within normal limits  PREGNANCY, URINE    RADIOLOGY Lower extremity venous Doppler  Negative for DVT  ____________________________________________  FINAL ASSESSMENT AND PLAN  Leg pain, edema, Hyperthyroidism  Plan: Patient's labs and imaging were dictated above. Patient had presented for leg pain which may be related to her hyperthyroidism. She has been taken off of her thyroid medication and appears to be hyperthyroid at this time. She was started back on methimazole and propranolol. She has been advised to have close outpatient follow-up with her doctor for recheck. She was also given tramadol for pain in her legs.   Emily Filbert, MD   Note: This note was generated in part or whole with voice recognition software. Voice recognition is usually quite accurate but there are transcription errors that can and very often do occur. I apologize for any typographical errors that were not detected and corrected.     Emily Filbert, MD 05/04/17 2119

## 2017-05-04 NOTE — ED Notes (Signed)
Pt discussed with Dr Mayford KnifeWilliams, verbal order given for labs and Bilateral LE U/S.

## 2017-05-04 NOTE — ED Notes (Signed)
Patient transported to Ultrasound 

## 2017-05-04 NOTE — ED Triage Notes (Signed)
Pt to ED via POV c/o bilateral ankle swelling that started yesterday. Pt states that she is also having pain in the left ankle. Pt denies recent injury to the area. No pitting noted, no redness, or warmth.

## 2017-09-27 ENCOUNTER — Encounter: Payer: Self-pay | Admitting: Emergency Medicine

## 2017-09-27 ENCOUNTER — Emergency Department: Payer: Self-pay

## 2017-09-27 ENCOUNTER — Emergency Department
Admission: EM | Admit: 2017-09-27 | Discharge: 2017-09-27 | Disposition: A | Discharge status: Home / Self Care | Payer: Self-pay | Attending: Emergency Medicine | Admitting: Emergency Medicine

## 2017-09-27 DIAGNOSIS — F17210 Nicotine dependence, cigarettes, uncomplicated: Secondary | ICD-10-CM | POA: Insufficient documentation

## 2017-09-27 DIAGNOSIS — R946 Abnormal results of thyroid function studies: Secondary | ICD-10-CM | POA: Insufficient documentation

## 2017-09-27 DIAGNOSIS — R1310 Dysphagia, unspecified: Secondary | ICD-10-CM | POA: Insufficient documentation

## 2017-09-27 DIAGNOSIS — R591 Generalized enlarged lymph nodes: Secondary | ICD-10-CM | POA: Insufficient documentation

## 2017-09-27 HISTORY — DX: Thyroiditis, unspecified: E06.9

## 2017-09-27 HISTORY — DX: Thyrotoxicosis, unspecified without thyrotoxic crisis or storm: E05.90

## 2017-09-27 HISTORY — DX: Nontoxic goiter, unspecified: E04.9

## 2017-09-27 LAB — CBC WITH DIFFERENTIAL/PLATELET
BASOS ABS: 0 10*3/uL (ref 0–0.1)
BASOS PCT: 0 %
EOS ABS: 0 10*3/uL (ref 0–0.7)
Eosinophils Relative: 0 %
HCT: 36.9 % (ref 35.0–47.0)
HEMOGLOBIN: 12.7 g/dL (ref 12.0–16.0)
Lymphocytes Relative: 33 %
Lymphs Abs: 1.9 10*3/uL (ref 1.0–3.6)
MCH: 28.6 pg (ref 26.0–34.0)
MCHC: 34.3 g/dL (ref 32.0–36.0)
MCV: 83.3 fL (ref 80.0–100.0)
MONOS PCT: 7 %
Monocytes Absolute: 0.4 10*3/uL (ref 0.2–0.9)
NEUTROS ABS: 3.4 10*3/uL (ref 1.4–6.5)
NEUTROS PCT: 60 %
Platelets: 177 10*3/uL (ref 150–440)
RBC: 4.43 MIL/uL (ref 3.80–5.20)
RDW: 13.3 % (ref 11.5–14.5)
WBC: 5.8 10*3/uL (ref 3.6–11.0)

## 2017-09-27 LAB — COMPREHENSIVE METABOLIC PANEL
ALBUMIN: 3.6 g/dL (ref 3.5–5.0)
ALK PHOS: 88 U/L (ref 38–126)
ALT: 19 U/L (ref 14–54)
ANION GAP: 7 (ref 5–15)
AST: 22 U/L (ref 15–41)
BUN: 15 mg/dL (ref 6–20)
CALCIUM: 9.3 mg/dL (ref 8.9–10.3)
CO2: 26 mmol/L (ref 22–32)
Chloride: 106 mmol/L (ref 101–111)
Creatinine, Ser: <0.3 mg/dL — ABNORMAL LOW (ref 0.44–1.00)
Glucose, Bld: 106 mg/dL — ABNORMAL HIGH (ref 65–99)
Potassium: 3.7 mmol/L (ref 3.5–5.1)
SODIUM: 139 mmol/L (ref 135–145)
TOTAL PROTEIN: 6.6 g/dL (ref 6.5–8.1)
Total Bilirubin: 0.7 mg/dL (ref 0.3–1.2)

## 2017-09-27 LAB — POCT PREGNANCY, URINE: PREG TEST UR: NEGATIVE

## 2017-09-27 LAB — TSH: TSH: <0.01 u[IU]/mL — ABNORMAL LOW (ref 0.350–4.500)

## 2017-09-27 LAB — T4, FREE: FREE T4: 4.98 ng/dL — AB (ref 0.61–1.12)

## 2017-09-27 MED ORDER — IOPAMIDOL (ISOVUE-300) INJECTION 61%
75.0000 mL | Freq: Once | INTRAVENOUS | Status: AC | PRN
  Administered 2017-09-27: 75 mL via INTRAVENOUS

## 2017-09-27 MED ORDER — SODIUM CHLORIDE 0.9 % IV BOLUS (SEPSIS)
1000.0000 mL | Freq: Once | INTRAVENOUS | Status: AC
  Administered 2017-09-27: 1000 mL via INTRAVENOUS

## 2017-09-27 NOTE — ED Provider Notes (Addendum)
[facility] Emergency Department Provider Note  ____________________________________________   I have reviewed the triage vital signs and the nursing notes.   HISTORY  Chief Complaint Goiter    HPI Vanessa Rivera is a 31 y.o. female who presents today complaining of her goiter interfering with her swallowing. Patient has a diffuse goiter, known from prior CT and workup in the emergency room however she states that she has not been compliant with her medications for several months has never seen an endocrinologist and has been given medication essentially based upon what she was given in the emergency department during her last visit. She does not report or when that was. Patient states she no she should be compliant but she is not been taking her meds. She would not be here because of her thyroid disease for any other reason except for the fact that she has noticed that the goiter is making it difficult for her to swallow solid foods. It feels "hung up" when she swallows. Patient states that it is also somewhat tender. She does not have any fever or chills. She states it is not usually tender. Patient states that she does not feel more tachycardic or weak than normal and she states that she has had no fevers or significant weight loss.     Past Medical History:  Diagnosis Date  . Goiter   . Hyperthyroidism   . Thyroid disease   . Thyroiditis     There are no active problems to display for this patient.   No past surgical history on file.  Prior to Admission medications   Medication Sig Start Date End Date Taking? Authorizing Provider  ketorolac (TORADOL) 10 MG tablet Take 1 tablet (10 mg total) by mouth every 8 (eight) hours as needed for moderate pain (with food). 11/26/16   Rockne Menghini, MD  methimazole (TAPAZOLE) 5 MG tablet Take 3 tablets (15 mg total) by mouth 3 (three) times daily. 08/09/16   Emily Filbert, MD  methimazole (TAPAZOLE)  5 MG tablet Take 1 tablet (5 mg total) by mouth 3 (three) times daily. 05/04/17   Emily Filbert, MD  predniSONE (DELTASONE) 20 MG tablet Take 2 tablets (40 mg total) by mouth daily. 11/01/16   Rebecka Apley, MD  propranolol (INDERAL) 10 MG tablet Take 1 tablet (10 mg total) by mouth 3 (three) times daily. 08/09/16   Emily Filbert, MD  propranolol (INDERAL) 10 MG tablet Take 1 tablet (10 mg total) by mouth 3 (three) times daily. 05/04/17   Emily Filbert, MD  traMADol (ULTRAM) 50 MG tablet Take 1 tablet (50 mg total) by mouth every 6 (six) hours as needed. 05/04/17 05/04/18  Emily Filbert, MD    Allergies Penicillins  No family history on file.  Social History Social History  Substance Use Topics  . Smoking status: Current Every Day Smoker  . Smokeless tobacco: Never Used  . Alcohol use No    Review of Systems Constitutional: No fever/chills Eyes: No visual changes. ENT: No sore throat. No stiff neck no neck pain Cardiovascular: Denies chest pain. Respiratory: Denies shortness of breath. Gastrointestinal:   no vomiting.  No diarrhea.  No constipation. Genitourinary: Negative for dysuria. Musculoskeletal: Negative lower extremity swelling Skin: Negative for rash. Neurological: Negative for severe headaches, focal weakness or numbness.   ____________________________________________   PHYSICAL EXAM:  VITAL SIGNS: ED Triage Vitals  Enc Vitals Group     BP 09/27/17 1302 119/78  Pulse Rate 09/27/17 1302 (!) 135     Resp 09/27/17 1302 20     Temp 09/27/17 1300 98.4 F (36.9 C)     Temp Source 09/27/17 1300 Oral     SpO2 09/27/17 1302 99 %     Weight 09/27/17 1301 120 lb (54.4 kg)     Height 09/27/17 1301  (1.6 m)     Head Circumference --      Peak Flow --      Pain Score 09/27/17 1300 7     Pain Loc --      Pain Edu? --      Excl. in GC? --     Constitutional: Alert and oriented. Well appearing and in no acute distress. Eyes:  Conjunctivae are normal Head: Atraumatic HEENT: No congestion/rhinnorhea. Mucous membranes are moist.  Oropharynx non-erythematous Neck:   is a clear right-sided principally goiter like mass in the right anterior with mild tenderness but no erythema or fluctuance no masses, no stridor Cardiovascular: slight tachycardia rate 108, regular rhythm. Grossly normal heart sounds.  Good peripheral circulation. Respiratory: Normal respiratory effort.  No retractions. Lungs CTAB. Abdominal: Soft and nontender. No distention. No guarding no rebound Back:  There is no focal tenderness or step off.  there is no midline tenderness there are no lesions noted. there is no CVA tenderness Musculoskeletal: No lower extremity tenderness, no upper extremity tenderness. No joint effusions, no DVT signs strong distal pulses no edema Neurologic:  Normal speech and language. No gross focal neurologic deficits are appreciated.  Skin:  Skin is warm, dry and intact. No rash noted. Psychiatric: Mood and affect are normal. Speech and behavior are normal.  ____________________________________________   LABS (all labs ordered are listed, but only abnormal results are displayed)  Labs Reviewed  COMPREHENSIVE METABOLIC PANEL - Abnormal; Notable for the following:       Result Value   Glucose, Bld 106 (*)    Creatinine, Ser <0.30 (*)    All other components within normal limits  TSH - Abnormal; Notable for the following:    TSH <0.010 (*)    All other components within normal limits  T4, FREE - Abnormal; Notable for the following:    Free T4 4.98 (*)    All other components within normal limits  CBC WITH DIFFERENTIAL/PLATELET  THYROID PANEL WITH TSH  POC URINE PREG, ED    Pertinent labs  results that were available during my care of the patient were reviewed by me and considered in my medical decision making (see chart for details). ____________________________________________  EKG  I personally interpreted any  EKGs ordered by me or triage sinus tach rate 123, no acute ST elevation or depression, normal axis, LVH noted. ____________________________________________  RADIOLOGY  Pertinent labs & imaging results that were available during my care of the patient were reviewed by me and considered in my medical decision making (see chart for details). If possible, patient and/or family made aware of any abnormal findings. ____________________________________________    PROCEDURES  Procedure(s) performed: None  Procedures  Critical Care performed: None  ____________________________________________   INITIAL IMPRESSION / ASSESSMENT AND PLAN / ED COURSE  Pertinent labs & imaging results that were available during my care of the patient were reviewed by me and considered in my medical decision making (see chart for details).  patient here with pain and swelling and difficulty swallowing as a result of a goiter. She has not had any adequate outpatient care she states there  is nothing in the computer that I can find to validate her outpatient care. She is noncompliant in any event. Given that she is now having difficulty swallowing, there was a diffuse goiter with diffuse lymphadenopathy in the past that he fell unfortunately that imaging would be of utility assessment over a year since be imaged. We will obtain CT scan, thyroid tests show very low TSH consistent with her known hyperthyroid and she has free T4 which is very elevated again consistent with prior. She is somewhat tachycardic here, but she states she's been having somewhat decreased by mouth Will give her fluid, she is not confused, don't believe she meets criteria for thyroid storm. She is afebrile etc. However, certainly her uncontrolled thyroid is contributing to her sinus tach. We will obtain EKG as a precaution. She is not anemic. We will evaluate further after CT    ____________________________________________   FINAL CLINICAL  IMPRESSION(S) / ED DIAGNOSES  Final diagnoses:  None      This chart was dictated using voice recognition software.  Despite best efforts to proofread,  errors can occur which can change meaning.      Jeanmarie Plant, MD 09/27/17 1449    Jeanmarie Plant, MD 09/27/17 1450

## 2017-09-27 NOTE — ED Provider Notes (Addendum)
This patient was signed out to me by Dr. Sherlene Shams. 31 year old female with a history of thyroid goiter, abnormal imaging in 2017, who has not followed up. The patient is here because of one week of difficulty with swallowing. The patient's CT was signed out to me, and does show multiple abnormal findings including continued consistent goiter,with persistent enlargement of the adenoid Palatine and lingual tonsils as well as numerous bilateral cervical lymph nodes. I plan to speak with oncology about these findings for follow-up. The patient also has abnormal TSH and T4, and will see endocrinology. Malignancy is a concern, the patient is aware of this. She will follow up with both oncology and endocrinology as soon as possible. Return precautions were discussed. Here, the patient is able to drink liquid and is not at risk for dehydration.  ----------------------------------------- 5:13 PM on 09/27/2017 -----------------------------------------  I have spoken directly with Rickard Patience, MD, oncology for quick outpatient follow up.  Unfortunate, we have no endocrine coverage today, but I will have the patient follow up with her primary care physician who can help her with endocrine follow-up. I have spoken to the patient about these results as well as follow-up plan and return precautions. She understands and was able to ask questions.  Upon arrival, the patient's heart rate was in the 130s and is now 107. However, I prefer to continue to give the patient intravenous fluids to make sure that her heart rate comes down appropriately. She states that she is anxious to leave, but will stay for 15-20 minutes to receive another liter of intravenous fluids. She does not have any other signs or symptoms that would be concerning for acute thyrotoxicosis at this time.   Rockne Menghini, MD 09/27/17 1714    Rockne Menghini, MD 09/27/17 1721

## 2017-09-27 NOTE — ED Notes (Signed)
Ask pt to give urine sample when able to check for pregnancy. Pt reports "I am on my period I am not pregnant."

## 2017-09-27 NOTE — Discharge Instructions (Signed)
The findings in your CT scan are concerning for abnormal thyroid size and multiple enlarged lymph nodes, and enlarged tonsils.  This can sometimes be due to cancer and needs quick evaluation by a cancer doctor.    You will also need to follow-up with an endocrinology (hormone) doctor due to your abnormal thyroid tests. We do not have an endocrinology doctor on call today, but your primary care physician can help you with follow-up.  Please make an appointment as soon as possible.  Drink plenty of fluid to stay well-hydrated.  Return to the emergency department for severe pain, inability to keep down fluids, concerns of dehydration, lightheadedness or fainting, or any other symptoms concerning to you.

## 2017-09-27 NOTE — ED Triage Notes (Signed)
Pt c/o pain to neck. Visible swelling. Hx goiter.  Has been having trouble passing solid foods but is able to drink without difficulty. No dyspnea noted.  Also has hyperthyroidism but has not had medication in months because cannot afford.  Tachy in triage.

## 2017-09-27 NOTE — ED Notes (Signed)
Urine Preg is negative.  

## 2017-09-27 NOTE — ED Notes (Signed)
Pt given dinner tray. Will continue to monitor.  

## 2017-09-27 NOTE — ED Notes (Signed)
Patient transported to CT 

## 2017-09-27 NOTE — ED Notes (Signed)
Pt ambulatory to her POV. Skin PWD. VSS, NAD.

## 2017-09-28 ENCOUNTER — Telehealth: Payer: Self-pay | Admitting: Emergency Medicine

## 2017-09-28 LAB — THYROID PANEL WITH TSH
FREE THYROXINE INDEX: 12 — AB (ref 1.2–4.9)
T3 Uptake Ratio: 50 % — ABNORMAL HIGH (ref 24–39)
T4 TOTAL: 24 ug/dL — AB (ref 4.5–12.0)

## 2017-09-28 NOTE — Telephone Encounter (Signed)
Called patient to ask about follow up plans.  Her mobilenumber is not in service.  The home number has no answer and no voicemail available.

## 2017-10-01 ENCOUNTER — Telehealth: Payer: Self-pay | Admitting: Oncology

## 2017-10-01 NOTE — Telephone Encounter (Signed)
Thank you :)

## 2017-10-01 NOTE — Telephone Encounter (Signed)
Called Vanessa Rivera to scheduled h\f, but she did not answer and the pt's voicemail is not setup. The second number listed in not in working order. Will mail a follow-up appt to the address provided in EPIC.

## 2017-10-04 ENCOUNTER — Other Ambulatory Visit: Payer: Self-pay

## 2017-10-04 ENCOUNTER — Inpatient Hospital Stay: Payer: Self-pay | Attending: Oncology | Admitting: Oncology

## 2017-10-04 ENCOUNTER — Encounter: Payer: Self-pay | Admitting: Oncology

## 2017-10-04 ENCOUNTER — Inpatient Hospital Stay: Payer: Self-pay

## 2017-10-04 VITALS — BP 112/57 | HR 84 | Temp 97.7°F | Resp 16 | Wt 123.0 lb

## 2017-10-04 DIAGNOSIS — F17210 Nicotine dependence, cigarettes, uncomplicated: Secondary | ICD-10-CM | POA: Insufficient documentation

## 2017-10-04 DIAGNOSIS — Z9119 Patient's noncompliance with other medical treatment and regimen: Secondary | ICD-10-CM | POA: Insufficient documentation

## 2017-10-04 DIAGNOSIS — R591 Generalized enlarged lymph nodes: Secondary | ICD-10-CM | POA: Insufficient documentation

## 2017-10-04 DIAGNOSIS — E049 Nontoxic goiter, unspecified: Secondary | ICD-10-CM

## 2017-10-04 DIAGNOSIS — E0500 Thyrotoxicosis with diffuse goiter without thyrotoxic crisis or storm: Secondary | ICD-10-CM | POA: Insufficient documentation

## 2017-10-04 DIAGNOSIS — Z720 Tobacco use: Secondary | ICD-10-CM

## 2017-10-04 DIAGNOSIS — E0590 Thyrotoxicosis, unspecified without thyrotoxic crisis or storm: Secondary | ICD-10-CM

## 2017-10-04 DIAGNOSIS — Z8042 Family history of malignant neoplasm of prostate: Secondary | ICD-10-CM | POA: Insufficient documentation

## 2017-10-04 NOTE — Progress Notes (Signed)
Patient here today as a new patient.  Patient c/o ankle and knee pain when touched, trouble swallowing solid foods

## 2017-10-04 NOTE — Progress Notes (Signed)
Hematology/Oncology Consult note [facility] Telephone:([phone] [phone]   Patient Care Team: Patient, No Pcp Per as PCP - General (General Practice)  REFERRING PROVIDER: Dr.Norman [facility] CHIEF COMPLAINTS/PURPOSE OF CONSULTATION:   Neck Lymphadenopathy.   HISTORY OF PRESENTING ILLNESS:  Vanessa Rivera is a 31 y.o.  female with PMH listed below who was referred by xxx to me for evaluation of cervical lymphadenopathy . Patient has history of goiter, hyperthyroidism. He was prescribed to take methimazole in the past which she is noncompliant. She is not following up with an endocrinologist.patient presented to emergency room on 09/27/2017 complaining is interfering with her swallowing. Reports pain with swallowing, and no goiter is tender. She has never seen a endocrinologist. She has a significant family history of thyroid disease. She works at [employer] and also has 2 kids. Denies any fever, chills, chest pain,shortness of breath, palpitation, She reports weight loss, sweating and fatigue. She had a CT scan done in November 2017 which showeddiffuse enlarged thyroid, as well as increased number ofcervical. lymph nodes. She also has bilateral pallor Tina and lingual tonsillar hypertrophy with hypertrophy of the adenoidal soft tissues. Patient did not follow-up for any medical advice. This time during her visit in ER, another CT soft tissue neck with contrast was done, reviewed and no retropharyngeal or peritonsillar abscess There is persistent enlargement of adenoid, palate 18, and lingual tonsils with numerous bilateral cervical lymph nodes, and a prominent anterior mediastinal soft tissue.ER physician Dr. Sharma Covert referred patient to [place] for follow-up and further evaluation.   ROS:  Review of Systems  Constitutional: Negative for fever, night sweats,positive unintentional weight loss, positive for sweating. Positive for fatigue HENT: Negative for  ear pain, hearing loss, nasal bleeding Eyes: Negative for eye pain, double vision   Respiratory: Negative for wheezing, shortness of breath, cough Cardiovascular: Negative for chest pain, palpitation.   Gastrointestinal: Negative abdominal pain, diarrhea, nausea vomiting. Positive for dysphagia, swallowing pain. Endocrine: Negative  Genitourinary: Negative for dysuria, hematuria, frequency Skin: Negative for rash, iching, bruising Neurological: Negative for headache, dizziness, seizure Hematological: Negative for easy bruising/bleeding, positive forlymph node enlargement Psychiatric/Behavioral: Negative for depression, suicidality, positive for anxious and anxiety  MEDICAL HISTORY:  Past Medical History:  Diagnosis Date  . Goiter   . Hyperthyroidism   . Thyroid disease   . Thyroiditis     SURGICAL HISTORY: History reviewed. No pertinent surgical history.  SOCIAL HISTORY: Social History   Social History  . Marital status: Single    Spouse name: N/A  . Number of children: N/A  . Years of education: N/A   Occupational History  . Not on file.   Social History Main Topics  . Smoking status: Current Every Day Smoker    Packs/day: 1.00    Years: 7.00    Types: Cigarettes  . Smokeless tobacco: Never Used  . Alcohol use No  . Drug use: No  . Sexual activity: No   Other Topics Concern  . Not on file   Social History Narrative  . No narrative on file    FAMILY HISTORY: Family History  Problem Relation Age of Onset  . Thyroid disease Mother   . Thyroid disease Maternal Aunt   . Prostate cancer Maternal Grandfather     ALLERGIES:  is allergic to penicillins.  MEDICATIONS:  Current Outpatient Prescriptions  Medication Sig Dispense Refill  . methimazole (TAPAZOLE) 5 MG tablet Take 3 tablets (15 mg total) by mouth 3 (three) times daily. 270  tablet 0  . propranolol (INDERAL) 10 MG tablet Take 1 tablet (10 mg total) by mouth 3 (three) times daily. (Patient not taking:  Reported on 10/04/2017) 90 tablet 0   No current facility-administered medications for this visit.       Marland Kitchen.  PHYSICAL EXAMINATION: ECOG PERFORMANCE STATUS: 1 - Symptomatic but completely ambulatory Vitals:   10/04/17 1447  BP: (!) 112/57  Pulse: 84  Resp: 16  Temp: 97.7 F (36.5 C)   Filed Weights   10/04/17 1447  Weight: 123 lb (55.8 kg)   GENERAL: No distress, well nourished.  SKIN:  No rashes or significant lesions  HEAD: Normocephalic, No masses, lesions, tenderness or abnormalities  EYES: Conjunctiva are pink, non icteric ENT: External ears normal ,lips , buccal mucosa, and tongue normal and mucous membranes are moist. Diffusely enlarged thyoid, tender with palpation.  LYMPH: (+) cervical lymphadenopathy  LUNGS: Clear to auscultation, no crackles or wheezes HEART: Regular rate & rhythm, no murmurs, no gallops, S1 normal and S2 normal  ABDOMEN: Abdomen soft, non-tender, normal bowel sounds, I did not appreciate any  masses or organomegaly  MUSCULOSKELETAL: No CVA tenderness and no tenderness on percussion of the back or rib cage.  EXTREMITIES: bilateral trace edema, no skin discoloration or tenderness NEURO: Alert & oriented, no focal motor/sensory deficits.    LABORATORY DATA:  I have reviewed the data as listed Lab Results  Component Value Date   WBC 5.8 09/27/2017   HGB 12.7 09/27/2017   HCT 36.9 09/27/2017   MCV 83.3 09/27/2017   PLT 177 09/27/2017    Recent Labs  11/26/16 1124 05/04/17 1658 09/27/17 1308  NA 137 138 139  K 3.5 3.6 3.7  CL 103 107 106  CO2 29 26 26   GLUCOSE 128* 88 106*  BUN 10 9 15   CREATININE 0.57 0.38* <0.30*  CALCIUM 9.0 8.9 9.3  GFRNONAA >60 >60 NOT CALCULATED  GFRAA >60 >60 NOT CALCULATED  PROT  --  6.2* 6.6  ALBUMIN  --  3.6 3.6  AST  --  22 22  ALT  --  13* 19  ALKPHOS  --  73 88  BILITOT  --  0.4 0.7    RADIOGRAPHIC STUDIES: I have personally reviewed the radiological images as listed and agreed with the findings  in the report. 09/27/2017  IMPRESSION: 1. No retropharyngeal or peritonsillar abscess. No other acute abnormality to explain the reported dysphagia. 2. 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 These findings are abnormal for age and raise suspicion of lymphoma or other lymphoproliferative disorder.    ASSESSMENT & PLAN:  1. Lymphadenopathy   2. Hyperthyroidism   3. Goiter   4. Tobacco abuse    # Discussed with patient that she needs to follow up with endocrinologist. She resumed taking methimazole for the past week.I advised her that lab worked to be monitored closely as methimazole can give severe side effects including agranulocytosis. She is very symptomatic from compression of the goiter. I will also refer her to see ENT for examination, and evaluation, and may be surgical options for thyroid resection.  # The cervical lymph nodes can be reactive, or secondary to a chronic indolent lymphoma.the lymphadenopathy seems to be chronic since last November. I will start with some basic lab work including CBC, CMP, peripheral blood flow cytometry, LDH. If indicated, she may need a PET scan as well as  cervical lymph node sampling. # >Smoking cessation discussed in detail with patient. Smoking cessation program information provided to patient All questions were answered. The patient knows to call the clinic with any problems questions or concerns.  Return of visit: 2 weeks. Thank you for this kind referral and the opportunity to participate in the care of this patient. A copy of today's note is routed to referring provider    Rickard Patience, MD, PhD Hematology Oncology [facility] Pager- [phone] 10/04/2017

## 2017-10-04 NOTE — Addendum Note (Signed)
Addended by: Marchia MeiersEASTWOOD, Emilianna Barlowe M on: 10/04/2017 04:54 PM   Modules accepted: Orders

## 2017-10-07 ENCOUNTER — Other Ambulatory Visit: Payer: Self-pay

## 2017-10-07 DIAGNOSIS — Z79899 Other long term (current) drug therapy: Secondary | ICD-10-CM | POA: Insufficient documentation

## 2017-10-07 DIAGNOSIS — F17210 Nicotine dependence, cigarettes, uncomplicated: Secondary | ICD-10-CM | POA: Insufficient documentation

## 2017-10-07 DIAGNOSIS — E049 Nontoxic goiter, unspecified: Secondary | ICD-10-CM | POA: Insufficient documentation

## 2017-10-07 DIAGNOSIS — E0590 Thyrotoxicosis, unspecified without thyrotoxic crisis or storm: Secondary | ICD-10-CM | POA: Insufficient documentation

## 2017-10-07 LAB — CBC WITH DIFFERENTIAL/PLATELET
BASOS PCT: 1 %
Basophils Absolute: 0.1 10*3/uL (ref 0–0.1)
EOS ABS: 0.1 10*3/uL (ref 0–0.7)
EOS PCT: 2 %
HCT: 37.6 % (ref 35.0–47.0)
HEMOGLOBIN: 12.8 g/dL (ref 12.0–16.0)
LYMPHS ABS: 3.2 10*3/uL (ref 1.0–3.6)
Lymphocytes Relative: 48 %
MCH: 28.6 pg (ref 26.0–34.0)
MCHC: 34 g/dL (ref 32.0–36.0)
MCV: 84 fL (ref 80.0–100.0)
MONO ABS: 0.4 10*3/uL (ref 0.2–0.9)
MONOS PCT: 5 %
NEUTROS PCT: 44 %
Neutro Abs: 2.9 10*3/uL (ref 1.4–6.5)
PLATELETS: 203 10*3/uL (ref 150–440)
RBC: 4.48 MIL/uL (ref 3.80–5.20)
RDW: 13.3 % (ref 11.5–14.5)
WBC: 6.6 10*3/uL (ref 3.6–11.0)

## 2017-10-07 LAB — BASIC METABOLIC PANEL
Anion gap: 4 — ABNORMAL LOW (ref 5–15)
BUN: 10 mg/dL (ref 6–20)
CALCIUM: 9 mg/dL (ref 8.9–10.3)
CO2: 27 mmol/L (ref 22–32)
CREATININE: 0.54 mg/dL (ref 0.44–1.00)
Chloride: 105 mmol/L (ref 101–111)
GFR calc non Af Amer: >60 mL/min (ref >60)
Glucose, Bld: 118 mg/dL — ABNORMAL HIGH (ref 65–99)
Potassium: 3.4 mmol/L — ABNORMAL LOW (ref 3.5–5.1)
SODIUM: 136 mmol/L (ref 135–145)

## 2017-10-07 LAB — TSH

## 2017-10-07 LAB — T4, FREE: FREE T4: 0.84 ng/dL (ref 0.61–1.12)

## 2017-10-07 NOTE — ED Triage Notes (Signed)
Patient reports neck swelling, concerned about her thyroid.  Reports seen last week and diagnosed with hypothyroid and she got her medication and has been taking it.  Patient speaking in complete sentences, voices no concerns about breathing.  No acute respiratory distress noted.

## 2017-10-07 NOTE — ED Notes (Signed)
2155 spoke with Dr Fanny BienQuale regarding patient, order received.

## 2017-10-08 ENCOUNTER — Encounter: Payer: Self-pay | Admitting: Emergency Medicine

## 2017-10-08 ENCOUNTER — Other Ambulatory Visit: Payer: Self-pay | Admitting: *Deleted

## 2017-10-08 ENCOUNTER — Emergency Department
Admission: EM | Admit: 2017-10-08 | Discharge: 2017-10-08 | Disposition: A | Discharge status: Home / Self Care | Payer: Self-pay | Attending: Emergency Medicine | Admitting: Emergency Medicine

## 2017-10-08 DIAGNOSIS — E049 Nontoxic goiter, unspecified: Secondary | ICD-10-CM

## 2017-10-08 DIAGNOSIS — R591 Generalized enlarged lymph nodes: Secondary | ICD-10-CM | POA: Insufficient documentation

## 2017-10-08 DIAGNOSIS — E0590 Thyrotoxicosis, unspecified without thyrotoxic crisis or storm: Secondary | ICD-10-CM | POA: Insufficient documentation

## 2017-10-08 LAB — TROPONIN I: Troponin I: <0.03 ng/mL (ref <0.03)

## 2017-10-08 MED ORDER — OXYCODONE-ACETAMINOPHEN 5-325 MG PO TABS
1.0000 | ORAL_TABLET | Freq: Once | ORAL | Status: AC
  Administered 2017-10-08: 1 via ORAL
  Filled 2017-10-08: qty 1

## 2017-10-08 MED ORDER — TRAMADOL HCL 50 MG PO TABS: 50.0000 mg | ORAL_TABLET | Freq: Four times a day (QID) | ORAL | 0 refills | Status: DC | PRN

## 2017-10-08 MED ORDER — SODIUM CHLORIDE 0.9 % IV BOLUS (SEPSIS)
1000.0000 mL | Freq: Once | INTRAVENOUS | Status: AC
  Administered 2017-10-08: 1000 mL via INTRAVENOUS

## 2017-10-08 MED ORDER — KETOROLAC TROMETHAMINE 60 MG/2ML IM SOLN
60.0000 mg | Freq: Once | INTRAMUSCULAR | Status: AC
  Administered 2017-10-08: 60 mg via INTRAMUSCULAR
  Filled 2017-10-08: qty 2

## 2017-10-08 MED ORDER — KETOROLAC TROMETHAMINE 30 MG/ML IJ SOLN
30.0000 mg | Freq: Once | INTRAMUSCULAR | Status: AC
  Administered 2017-10-08: 30 mg via INTRAVENOUS
  Filled 2017-10-08: qty 1

## 2017-10-08 MED ORDER — PROPRANOLOL HCL 10 MG PO TABS: 10.0000 mg | ORAL_TABLET | Freq: Three times a day (TID) | ORAL | 0 refills | Status: DC

## 2017-10-08 MED ORDER — PROPRANOLOL HCL 20 MG PO TABS
10.0000 mg | ORAL_TABLET | Freq: Once | ORAL | Status: AC
  Administered 2017-10-08: 10 mg via ORAL
  Filled 2017-10-08: qty 1

## 2017-10-08 NOTE — ED Provider Notes (Signed)
[facility] Emergency Department Provider Note   ____________________________________________   First MD Initiated Contact with Patient 10/08/17 0031     (approximate)  I have reviewed the triage vital signs and the nursing notes.   HISTORY  Chief Complaint No chief complaint on file.   HPI Vanessa Rivera is a 31 y.o. female we will comes into the hospital today with some neck swelling. The patient was seen here and diagnosed with hyperthyroidism and goiter. She reports that she is able to swallow liquids but food still get hung up. The patient reports that she started taking the thyroid medicine and it seems like her throat swelling has gotten worse. She reports that she thought it would go away. The patient has not yet made an appointment to see an endocrinologist but states that she did see the oncologist. She reports that her neck hurts and she is been taking Tylenol, ibuprofen and tramadol. She reports that none medications are helping. The patient rates her pain a 6 out of 10 in intensity. She reports that she didn't know that she needed to see an ENT doctor which was what was recommended by the oncologist. The patient is here for reevaluation. She reports that her legs and ankles are also hurting especially after standing at work.   Past Medical History:  Diagnosis Date  . Goiter   . Hyperthyroidism   . Thyroid disease   . Thyroiditis     There are no active problems to display for this patient.   History reviewed. No pertinent surgical history.  Prior to Admission medications   Medication Sig Start Date End Date Taking? Authorizing Provider  methimazole (TAPAZOLE) 5 MG tablet Take 3 tablets (15 mg total) by mouth 3 (three) times daily. 08/09/16  Yes Emily FilbertWilliams, Jonathan E, MD  propranolol (INDERAL) 10 MG tablet Take 1 tablet (10 mg total) by mouth 3 (three) times daily. 10/08/17   Rebecka ApleyWebster, Allison P, MD  traMADol (ULTRAM) 50 MG tablet Take 1  tablet (50 mg total) by mouth every 6 (six) hours as needed. 10/08/17   Rebecka ApleyWebster, Allison P, MD    Allergies Penicillins  Family History  Problem Relation Age of Onset  . Thyroid disease Mother   . Thyroid disease Maternal Aunt   . Prostate cancer Maternal Grandfather     Social History Social History  Substance Use Topics  . Smoking status: Current Every Day Smoker    Packs/day: 1.00    Years: 7.00    Types: Cigarettes  . Smokeless tobacco: Never Used  . Alcohol use No    Review of Systems  Constitutional: No fever/chills Eyes: No visual changes. ENT: tender goiter to anterior neck Cardiovascular: Denies chest pain. Respiratory: Denies shortness of breath. Gastrointestinal: No abdominal pain.  No nausea, no vomiting.  No diarrhea.  No constipation. Genitourinary: Negative for dysuria. Musculoskeletal: Negative for back pain. Skin: Negative for rash. Neurological: Negative for headaches, focal weakness or numbness.   ____________________________________________   PHYSICAL EXAM:  VITAL SIGNS: ED Triage Vitals [10/07/17 2151]  Enc Vitals Group     BP (!) 142/74     Pulse Rate (!) 122     Resp 20     Temp 98.3 F (36.8 C)     Temp Source Oral     SpO2 100 %     Weight      Height      Head Circumference      Peak Flow  Pain Score      Pain Loc      Pain Edu?      Excl. in GC?     Constitutional: Alert and oriented. Well appearing and in mild distress. Eyes: Conjunctivae are normal. PERRL. EOMI. Head: Atraumatic. Nose: No congestion/rhinnorhea. Mouth/Throat: Mucous membranes are moist.  Oropharynx non-erythematous. Neck:tender goiter to anterior neck Cardiovascular: Normal rate, regular rhythm. Grossly normal heart sounds.  Good peripheral circulation. Respiratory: Normal respiratory effort.  No retractions. Lungs CTAB. Gastrointestinal: Soft and nontender. No distention. positive bowel sounds Musculoskeletal: No lower extremity tenderness nor  edema.   Neurologic:  Normal speech and language.  Skin:  Skin is warm, dry and intact.  Psychiatric: Mood and affect are normal.   ____________________________________________   LABS (all labs ordered are listed, but only abnormal results are displayed)  Labs Reviewed  BASIC METABOLIC PANEL - Abnormal; Notable for the following:       Result Value   Potassium 3.4 (*)    Glucose, Bld 118 (*)    Anion gap 4 (*)    All other components within normal limits  TSH - Abnormal; Notable for the following:    TSH <0.010 (*)    All other components within normal limits  CBC WITH DIFFERENTIAL/PLATELET  T4, FREE  TROPONIN I   ____________________________________________  EKG  ED ECG REPORT I, Rebecka Apley, the attending physician, personally viewed and interpreted this ECG.   Date: 10/07/2017  EKG Time: 2200  Rate: 124  Rhythm: sinus tachycardia  Axis: right axis deviation  Intervals:none  ST&T Change: flipped T waves and q waves in leads II, III, aVF.  ____________________________________________  RADIOLOGY  No results found.  ____________________________________________   PROCEDURES  Procedure(s) performed: None  Procedures  Critical Care performed: No  ____________________________________________   INITIAL IMPRESSION / ASSESSMENT AND PLAN / ED COURSE  As part of my medical decision making, I reviewed the following data within the electronic MEDICAL RECORD NUMBER Notes from prior ED visits and [place] Controlled Substance Database   This is a 31 year old female who comes into the hospital today with some neck swelling and hyperthyroidism. The patient has been diagnosed multiple times and has been intermittently taking her medications. She is concerned that she is still having some neck swelling.  the patient's differential diagnosis includes goiter, lymphoma  Looking back at the patient's notes it does appear that she has been seen multiple times for her  hyperthyroidism in the past. While her heart rate was initially 122 when she arrived in triage it was improved by the time she returned to the room. I still though did give her a liter of normal saline and a dose of propranolol. Almost one year ago the patient was supposed to follow-up with an endocrinologist at [facility] which she did not. The oncologist was concerned about possible lymphoma given the patient's multiple lymph nodes. The recommendation at the time was to follow-up with ENT to have the goiter removed and evaluated. I informed the patient that the goiter will not go away just with medication but she likely has to have it removed. She reports that it is painful and has been previously diagnosed with thyroiditis. I did give the patient a dose of Toradol as well asPercocet. The patient will be discharged to follow-up with endocrinology as well as ENT.      ____________________________________________   FINAL CLINICAL IMPRESSION(S) / ED DIAGNOSES  Final diagnoses:  Goiter  Hyperthyroidism      NEW  MEDICATIONS STARTED DURING THIS VISIT:  Discharge Medication List as of 10/08/2017  2:17 AM    START taking these medications   Details  propranolol (INDERAL) 10 MG tablet Take 1 tablet (10 mg total) by mouth 3 (three) times daily., Starting Mon 10/08/2017, Print    traMADol (ULTRAM) 50 MG tablet Take 1 tablet (50 mg total) by mouth every 6 (six) hours as needed., Starting Mon 10/08/2017, Print         Note:  This document was prepared using Dragon voice recognition software and may include unintentional dictation errors.    Rebecka Apley, MD 10/08/17 [phone]

## 2017-10-08 NOTE — Discharge Instructions (Signed)
Please follow up with endocrine and with ENT to have your goiter removed.

## 2017-10-09 ENCOUNTER — Other Ambulatory Visit: Payer: Self-pay

## 2017-10-10 ENCOUNTER — Telehealth: Payer: Self-pay | Admitting: *Deleted

## 2017-10-10 ENCOUNTER — Other Ambulatory Visit: Payer: Self-pay

## 2017-10-10 NOTE — Telephone Encounter (Signed)
Called concerned about patient appointment tomorrow and that she is going to pay out of pocket and most likely end up [facility] for [facility] anyway. States that 9 times out of 10 patients who are self pay decide to [facility] program when they find out the cost of hospital charges here for surgery. He is willing to see her, but feels it would be a waste of her time and money to come here when she will [facility]. He states she is already being seen there by endocrinology.  I have attempted to reach patient on all numbers for her her sister and her friend and have not been able to reach any of them. I have left voice mail message to return my call.

## 2017-10-10 NOTE — Telephone Encounter (Signed)
I have attempted multiple times to reach patient and have been unsuccessful. I have called Dr Vincente PoliBennetts office and spoke with them regarding the situation as well. She stated she will put a message ion the chart

## 2017-10-11 NOTE — Telephone Encounter (Signed)
I spoke with Dr Vincente PoliBennetts office again and they were not able to get patient to answer phione or return call to them either. They expect her in their office an y time now and if she decides for up front referral [facility], they will contact us so that we know to make the referral.

## 2017-10-25 ENCOUNTER — Inpatient Hospital Stay: Payer: Self-pay | Admitting: Oncology

## 2017-10-26 ENCOUNTER — Encounter: Payer: Self-pay | Admitting: Oncology

## 2018-02-06 ENCOUNTER — Encounter: Payer: Self-pay | Admitting: Emergency Medicine

## 2018-02-06 ENCOUNTER — Other Ambulatory Visit: Payer: Self-pay

## 2018-02-06 ENCOUNTER — Emergency Department
Admission: EM | Admit: 2018-02-06 | Discharge: 2018-02-07 | Disposition: A | Discharge status: Home / Self Care | Payer: Medicaid Other | Attending: Emergency Medicine | Admitting: Emergency Medicine

## 2018-02-06 DIAGNOSIS — R05 Cough: Secondary | ICD-10-CM | POA: Insufficient documentation

## 2018-02-06 DIAGNOSIS — R000 Tachycardia, unspecified: Secondary | ICD-10-CM | POA: Insufficient documentation

## 2018-02-06 DIAGNOSIS — E0590 Thyrotoxicosis, unspecified without thyrotoxic crisis or storm: Secondary | ICD-10-CM | POA: Insufficient documentation

## 2018-02-06 DIAGNOSIS — F17210 Nicotine dependence, cigarettes, uncomplicated: Secondary | ICD-10-CM | POA: Insufficient documentation

## 2018-02-06 DIAGNOSIS — M791 Myalgia, unspecified site: Secondary | ICD-10-CM | POA: Insufficient documentation

## 2018-02-06 DIAGNOSIS — R51 Headache: Secondary | ICD-10-CM | POA: Insufficient documentation

## 2018-02-06 DIAGNOSIS — J09X9 Influenza due to identified novel influenza A virus with other manifestations: Secondary | ICD-10-CM | POA: Insufficient documentation

## 2018-02-06 DIAGNOSIS — J101 Influenza due to other identified influenza virus with other respiratory manifestations: Secondary | ICD-10-CM

## 2018-02-06 DIAGNOSIS — Z79899 Other long term (current) drug therapy: Secondary | ICD-10-CM | POA: Insufficient documentation

## 2018-02-06 LAB — INFLUENZA PANEL BY PCR (TYPE A & B)
Influenza A By PCR: NEGATIVE
Influenza B By PCR: NEGATIVE

## 2018-02-06 MED ORDER — OSELTAMIVIR PHOSPHATE 75 MG PO CAPS: 75.0000 mg | ORAL_CAPSULE | Freq: Two times a day (BID) | ORAL | 0 refills | Status: DC

## 2018-02-06 NOTE — ED Notes (Signed)
Pt reporting back aches and chills

## 2018-02-06 NOTE — ED Triage Notes (Addendum)
Patient to ER for c/o fever, body aches, and coughing. Patient denies anyone around her being sick that she is aware of. Unsure of what home temp has been. No medications taken at home for fever. +Bilateral ear pain.

## 2018-02-07 NOTE — ED Notes (Signed)
Pt. Verbalizes understanding of d/c instructions, medications, and follow-up.  Pt. In NAD at time of d/c and denies further concerns regarding this visit. Pt. Stable at the time of departure from the unit, departing unit by the safest and most appropriate manner per that pt condition and limitations with all belongings accounted for. Pt advised to return to the ED at any time for emergent concerns, or for new/worsening symptoms.   

## 2018-02-07 NOTE — ED Provider Notes (Signed)
[facility] Emergency Department Provider Note  ____________________________________________  Time seen: Approximately 12:20 AM  I have reviewed the triage vital signs and the nursing notes.   HISTORY  Chief Complaint Fever    HPI Vanessa Rivera is a 32 y.o. female presents to the emergency department with headache, rhinorrhea, congestion, nonproductive cough, fever and myalgias.  Patient has a history of hyperthyroidism.  Patient has experienced fever and chills at home.  She is tolerating fluids.  No recent travel.  She denies abdominal pain and chest pain.  Patient reports that she has a known large goiter and uncontrolled hyperthyroidism.  Tachycardia noted at triage.   Past Medical History:  Diagnosis Date  . Goiter   . Hyperthyroidism   . Thyroid disease   . Thyroiditis     Patient Active Problem List   Diagnosis Date Noted  . Goiter 10/08/2017  . Hyperthyroidism 10/08/2017  . Lymphadenopathy 10/08/2017    History reviewed. No pertinent surgical history.  Prior to Admission medications   Medication Sig Start Date End Date Taking? Authorizing Provider  methimazole (TAPAZOLE) 5 MG tablet Take 3 tablets (15 mg total) by mouth 3 (three) times daily. 08/09/16   Emily Filbert, MD  oseltamivir (TAMIFLU) 75 MG capsule Take 1 capsule (75 mg total) by mouth 2 (two) times daily for 5 days. 02/06/18 02/11/18  Orvil Feil, PA-C  propranolol (INDERAL) 10 MG tablet Take 1 tablet (10 mg total) by mouth 3 (three) times daily. 10/08/17   Rebecka Apley, MD  traMADol (ULTRAM) 50 MG tablet Take 1 tablet (50 mg total) by mouth every 6 (six) hours as needed. 10/08/17   Rebecka Apley, MD    Allergies Penicillins  Family History  Problem Relation Age of Onset  . Thyroid disease Mother   . Thyroid disease Maternal Aunt   . Prostate cancer Maternal Grandfather     Social History Social History   Tobacco Use  . Smoking status: Current  Every Day Smoker    Packs/day: 1.00    Years: 7.00    Pack years: 7.00    Types: Cigarettes  . Smokeless tobacco: Never Used  Substance Use Topics  . Alcohol use: No  . Drug use: No     Review of Systems  Constitutional: No fever/chills Eyes: No visual changes. No discharge ENT: No upper respiratory complaints. Cardiovascular: no chest pain. Respiratory: no cough. No SOB. Gastrointestinal: No abdominal pain.  No nausea, no vomiting.  No diarrhea.  No constipation. Musculoskeletal: Negative for musculoskeletal pain. Skin: Negative for rash, abrasions, lacerations, ecchymosis. Neurological: Negative for headaches, focal weakness or numbness.   ____________________________________________   PHYSICAL EXAM:  VITAL SIGNS: ED Triage Vitals  Enc Vitals Group     BP 02/06/18 2241 131/68     Pulse Rate 02/06/18 2241 (!) 131     Resp 02/06/18 2241 20     Temp 02/06/18 2241 99 F (37.2 C)     Temp Source 02/06/18 2241 Oral     SpO2 02/06/18 2241 99 %     Weight 02/06/18 2242 120 lb (54.4 kg)     Height 02/06/18 2242 5\' 3"  (1.6 m)     Head Circumference --      Peak Flow --      Pain Score 02/06/18 2241 8     Pain Loc --      Pain Edu? --      Excl. in GC? --    Constitutional: Alert  and oriented. Patient is lying supine. Eyes: Conjunctivae are normal. PERRL. EOMI. Head: Atraumatic. ENT:      Ears: Tympanic membranes are mildly injected with mild effusion bilaterally.       Nose: No congestion/rhinnorhea.      Mouth/Throat: Mucous membranes are moist. Posterior pharynx is mildly erythematous.  Hematological/Lymphatic/Immunilogical: No cervical lymphadenopathy.  Cardiovascular: Tachycardic, regular rhythm. Normal S1 and S2.  Good peripheral circulation. Respiratory: Normal respiratory effort without tachypnea or retractions. Lungs CTAB. Good air entry to the bases with no decreased or absent breath sounds. Gastrointestinal: Bowel sounds 4 quadrants. Soft and nontender to  palpation. No guarding or rigidity. No palpable masses. No distention. No CVA tenderness. Musculoskeletal: Full range of motion to all extremities. No gross deformities appreciated. Neurologic:  Normal speech and language. No gross focal neurologic deficits are appreciated.  Skin:  Skin is warm, dry and intact. No rash noted. Psychiatric: Mood and affect are normal. Speech and behavior are normal. Patient exhibits appropriate insight and judgement.   ____________________________________________   LABS (all labs ordered are listed, but only abnormal results are displayed)  Labs Reviewed  INFLUENZA PANEL BY PCR (TYPE A & B)   ____________________________________________  EKG   ____________________________________________  RADIOLOGY   No results found.  ____________________________________________    PROCEDURES  Procedure(s) performed:    Procedures    Medications - No data to display   ____________________________________________   INITIAL IMPRESSION / ASSESSMENT AND PLAN / ED COURSE  Pertinent labs & imaging results that were available during my care of the patient were reviewed by me and considered in my medical decision making (see chart for details).  Review of the [place] CSRS was performed in accordance of the [facility] prior to dispensing any controlled drugs.    Assessment and plan Influenza Differential diagnosis included influenza versus unspecified viral URI versus hyperthyroidism Patient presents to the emergency department with headache, rhinorrhea, congestion, nonproductive cough, fever and body aches.  Given history of uncontrolled hyperthyroidism, tachycardia noted at triage is expected.  Patient was treated empirically with Tamiflu.  The importance of compliance with methimazole was emphasized during this emergency department encounter.  Rest and hydration were encouraged.  Patient was advised to follow-up with primary care as  needed.    ____________________________________________  FINAL CLINICAL IMPRESSION(S) / ED DIAGNOSES  Final diagnoses:  Influenza A      NEW MEDICATIONS STARTED DURING THIS VISIT:  ED Discharge Orders        Ordered    oseltamivir (TAMIFLU) 75 MG capsule  2 times daily     02/06/18 2339          This chart was dictated using voice recognition software/Dragon. Despite best efforts to proofread, errors can occur which can change the meaning. Any change was purely unintentional.    Orvil FeilWoods, Eimy Plaza M, PA-C 02/07/18 0024    Myrna BlazerSchaevitz, David Matthew, MD 02/07/18 [phone]

## 2018-02-10 ENCOUNTER — Encounter: Payer: Self-pay | Admitting: Emergency Medicine

## 2018-02-10 ENCOUNTER — Emergency Department
Admission: EM | Admit: 2018-02-10 | Discharge: 2018-02-10 | Disposition: A | Discharge status: Home / Self Care | Payer: Self-pay | Attending: Emergency Medicine | Admitting: Emergency Medicine

## 2018-02-10 DIAGNOSIS — E0590 Thyrotoxicosis, unspecified without thyrotoxic crisis or storm: Secondary | ICD-10-CM | POA: Insufficient documentation

## 2018-02-10 DIAGNOSIS — F17210 Nicotine dependence, cigarettes, uncomplicated: Secondary | ICD-10-CM | POA: Insufficient documentation

## 2018-02-10 DIAGNOSIS — Z79899 Other long term (current) drug therapy: Secondary | ICD-10-CM | POA: Insufficient documentation

## 2018-02-10 DIAGNOSIS — J020 Streptococcal pharyngitis: Secondary | ICD-10-CM | POA: Insufficient documentation

## 2018-02-10 LAB — GROUP A STREP BY PCR: Group A Strep by PCR: DETECTED — AB

## 2018-02-10 MED ORDER — AZITHROMYCIN 250 MG PO TABS: ORAL_TABLET | ORAL | 0 refills | Status: DC

## 2018-02-10 MED ORDER — AZITHROMYCIN 500 MG PO TABS
500.0000 mg | ORAL_TABLET | Freq: Once | ORAL | Status: AC
  Administered 2018-02-10: 500 mg via ORAL
  Filled 2018-02-10: qty 1

## 2018-02-10 NOTE — ED Provider Notes (Signed)
[facility] Emergency Department Provider Note  ____________________________________________   First MD Initiated Contact with Patient 02/10/18 2201     (approximate)  I have reviewed the triage vital signs and the nursing notes.   HISTORY  Chief Complaint Sore Throat   HPI Vanessa Rivera is a 32 y.o. female is here with complaint of sore throat.  Patient states that she has not been aware of any fever.  She states that pain is increased with swallowing and that eating food has been difficult.  She is still able to swallow fluids without any difficulty.  As her pain is 5/10.   Past Medical History:  Diagnosis Date  . Goiter   . Hyperthyroidism   . Thyroid disease   . Thyroiditis     Patient Active Problem List   Diagnosis Date Noted  . Goiter 10/08/2017  . Hyperthyroidism 10/08/2017  . Lymphadenopathy 10/08/2017    History reviewed. No pertinent surgical history.  Prior to Admission medications   Medication Sig Start Date End Date Taking? Authorizing Provider  azithromycin (ZITHROMAX) 250 MG tablet Take 1 tablet each day for 4 days starting Monday 02/10/18   Tommi RumpsSummers, Menashe Kafer L, PA-C  methimazole (TAPAZOLE) 5 MG tablet Take 3 tablets (15 mg total) by mouth 3 (three) times daily. 08/09/16   Emily FilbertWilliams, Jonathan E, MD  propranolol (INDERAL) 10 MG tablet Take 1 tablet (10 mg total) by mouth 3 (three) times daily. 10/08/17   Rebecka ApleyWebster, Allison P, MD    Allergies Penicillins  Family History  Problem Relation Age of Onset  . Thyroid disease Mother   . Thyroid disease Maternal Aunt   . Prostate cancer Maternal Grandfather     Social History Social History   Tobacco Use  . Smoking status: Current Every Day Smoker    Packs/day: 1.00    Years: 7.00    Pack years: 7.00    Types: Cigarettes  . Smokeless tobacco: Never Used  Substance Use Topics  . Alcohol use: No  . Drug use: No    Review of Systems Constitutional: No fever/chills Eyes:  No visual changes. ENT: Positive sore throat. Cardiovascular: Denies chest pain. Respiratory: Denies shortness of breath. Gastrointestinal: No abdominal pain.  No nausea, no vomiting. Skin: Negative for rash. Neurological: Negative for headaches ____________________________________________   PHYSICAL EXAM:  VITAL SIGNS: ED Triage Vitals [02/10/18 2055]  Enc Vitals Group     BP (!) 141/86     Pulse Rate (!) 133     Resp 20     Temp 98.7 F (37.1 C)     Temp Source Oral     SpO2 100 %     Weight      Height      Head Circumference      Peak Flow      Pain Score 5     Pain Loc      Pain Edu?      Excl. in GC?    Constitutional: Alert and oriented. Well appearing and in no acute distress. Eyes: Conjunctivae are normal.  Head: Atraumatic. Nose: No congestion/rhinnorhea. Mouth/Throat: Mucous membranes are moist.  Oropharynx erythematous with uvula midline.  No exudate was present. Neck: No stridor.   Hematological/Lymphatic/Immunilogical: Moderate bilateral tender cervical lymphadenopathy. Cardiovascular: Normal rate, regular rhythm. Grossly normal heart sounds.  Good peripheral circulation. Respiratory: Normal respiratory effort.  No retractions. Lungs CTAB. Musculoskeletal: Moves upper and lower extremities without any difficulty and normal gait was noted. Neurologic:  Normal speech  and language. No gross focal neurologic deficits are appreciated.  Skin:  Skin is warm, dry and intact. No rash noted. Psychiatric: Mood and affect are normal. Speech and behavior are normal.  ____________________________________________   LABS (all labs ordered are listed, but only abnormal results are displayed)  Labs Reviewed  GROUP A STREP BY PCR - Abnormal; Notable for the following components:      Result Value   Group A Strep by PCR DETECTED (*)    All other components within normal limits    PROCEDURES  Procedure(s) performed: None  Procedures  Critical Care performed:  No  ____________________________________________   INITIAL IMPRESSION / ASSESSMENT AND PLAN / ED COURSE Patient was made aware that her strep test was positive.  Patient was given her first dose of Zithromax in the emergency department as all pharmacies of closed.  Patient was encouraged to drink lots of fluids.  Tylenol or ibuprofen as needed for fever and throat pain.  She was also given a note to remain out of work.  ____________________________________________   FINAL CLINICAL IMPRESSION(S) / ED DIAGNOSES  Final diagnoses:  Strep pharyngitis     ED Discharge Orders        Ordered    azithromycin (ZITHROMAX) 250 MG tablet     02/10/18 2314       Note:  This document was prepared using Dragon voice recognition software and may include unintentional dictation errors.    Tommi Rumps, PA-C 02/10/18 2320    Sharyn Creamer, MD 02/11/18 Perlie Mayo

## 2018-02-10 NOTE — Discharge Instructions (Signed)
[facility]up with your doctor in [facility] if any continued problems.  Begin taking Zithromax 1 daily for the next 4 days starting Monday.  You may take Tylenol or ibuprofen as needed for throat pain or fever.  Increase fluids.  You may eat soft foods until your throat is feeling better.

## 2018-02-10 NOTE — ED Triage Notes (Signed)
Pt is co sore throat, she has not had any fevers.  She says it hurts to swallow.  Pt is tachycardic at 131.  She states she has hyperthyroidism and takes medication for it.

## 2018-05-31 LAB — HM HIV SCREENING LAB: HM HIV Screening: NEGATIVE

## 2018-07-24 ENCOUNTER — Other Ambulatory Visit: Payer: Self-pay

## 2018-07-24 ENCOUNTER — Encounter: Payer: Self-pay | Admitting: Emergency Medicine

## 2018-07-24 ENCOUNTER — Emergency Department
Admission: EM | Admit: 2018-07-24 | Discharge: 2018-07-25 | Disposition: A | Discharge status: Home / Self Care | Payer: Self-pay | Attending: Emergency Medicine | Admitting: Emergency Medicine

## 2018-07-24 DIAGNOSIS — N12 Tubulo-interstitial nephritis, not specified as acute or chronic: Secondary | ICD-10-CM

## 2018-07-24 DIAGNOSIS — N10 Acute tubulo-interstitial nephritis: Secondary | ICD-10-CM | POA: Insufficient documentation

## 2018-07-24 DIAGNOSIS — F17210 Nicotine dependence, cigarettes, uncomplicated: Secondary | ICD-10-CM | POA: Insufficient documentation

## 2018-07-24 LAB — URINALYSIS, COMPLETE (UACMP) WITH MICROSCOPIC
Bilirubin Urine: NEGATIVE
GLUCOSE, UA: NEGATIVE mg/dL
KETONES UR: NEGATIVE mg/dL
Nitrite: POSITIVE — AB
PROTEIN: 100 mg/dL — AB
Specific Gravity, Urine: 1.012 (ref 1.005–1.030)
pH: 6 (ref 5.0–8.0)

## 2018-07-24 LAB — COMPREHENSIVE METABOLIC PANEL
ALBUMIN: 4.3 g/dL (ref 3.5–5.0)
ALT: 13 U/L (ref 0–44)
AST: 23 U/L (ref 15–41)
Alkaline Phosphatase: 96 U/L (ref 38–126)
Anion gap: 9 (ref 5–15)
BUN: 13 mg/dL (ref 6–20)
CHLORIDE: 102 mmol/L (ref 98–111)
CO2: 24 mmol/L (ref 22–32)
Calcium: 8.7 mg/dL — ABNORMAL LOW (ref 8.9–10.3)
Creatinine, Ser: 0.66 mg/dL (ref 0.44–1.00)
GFR calc Af Amer: >60 mL/min (ref >60)
GFR calc non Af Amer: >60 mL/min (ref >60)
GLUCOSE: 90 mg/dL (ref 70–99)
POTASSIUM: 3.3 mmol/L — AB (ref 3.5–5.1)
SODIUM: 135 mmol/L (ref 135–145)
Total Bilirubin: 1.3 mg/dL — ABNORMAL HIGH (ref 0.3–1.2)
Total Protein: 7.4 g/dL (ref 6.5–8.1)

## 2018-07-24 LAB — CBC WITH DIFFERENTIAL/PLATELET
BASOS ABS: 0 10*3/uL (ref 0–0.1)
BASOS PCT: 0 %
Eosinophils Absolute: 0 10*3/uL (ref 0–0.7)
Eosinophils Relative: 0 %
HEMATOCRIT: 38.5 % (ref 35.0–47.0)
Hemoglobin: 13.9 g/dL (ref 12.0–16.0)
Lymphocytes Relative: 12 %
Lymphs Abs: 1 10*3/uL (ref 1.0–3.6)
MCH: 31.5 pg (ref 26.0–34.0)
MCHC: 36.2 g/dL — ABNORMAL HIGH (ref 32.0–36.0)
MCV: 86.9 fL (ref 80.0–100.0)
MONO ABS: 0.1 10*3/uL — AB (ref 0.2–0.9)
Monocytes Relative: 2 %
NEUTROS ABS: 6.9 10*3/uL — AB (ref 1.4–6.5)
Neutrophils Relative %: 86 %
PLATELETS: 128 10*3/uL — AB (ref 150–440)
RBC: 4.43 MIL/uL (ref 3.80–5.20)
RDW: 13.5 % (ref 11.5–14.5)
WBC: 8.1 10*3/uL (ref 3.6–11.0)

## 2018-07-24 LAB — BLOOD GAS, VENOUS
Acid-base deficit: 0.1 mmol/L (ref 0.0–2.0)
Bicarbonate: 22.3 mmol/L (ref 20.0–28.0)
O2 Saturation: 89.6 %
PH VEN: 7.48 — AB (ref 7.250–7.430)
Patient temperature: 37
pCO2, Ven: 30 mmHg — ABNORMAL LOW (ref 44.0–60.0)
pO2, Ven: 53 mmHg — ABNORMAL HIGH (ref 32.0–45.0)

## 2018-07-24 LAB — LACTIC ACID, PLASMA: Lactic Acid, Venous: 1.6 mmol/L (ref 0.5–1.9)

## 2018-07-24 LAB — POCT PREGNANCY, URINE: PREG TEST UR: NEGATIVE

## 2018-07-24 MED ORDER — HYDROCODONE-ACETAMINOPHEN 5-325 MG PO TABS: 1.0000 | ORAL_TABLET | ORAL | 0 refills | Status: DC | PRN

## 2018-07-24 MED ORDER — KETOROLAC TROMETHAMINE 30 MG/ML IJ SOLN
30.0000 mg | Freq: Once | INTRAMUSCULAR | Status: AC
  Administered 2018-07-24: 30 mg via INTRAVENOUS
  Filled 2018-07-24: qty 1

## 2018-07-24 MED ORDER — SODIUM CHLORIDE 0.9 % IV BOLUS
1000.0000 mL | Freq: Once | INTRAVENOUS | Status: AC
  Administered 2018-07-24: 1000 mL via INTRAVENOUS

## 2018-07-24 MED ORDER — CEPHALEXIN 500 MG PO CAPS: 500.0000 mg | ORAL_CAPSULE | Freq: Four times a day (QID) | ORAL | 0 refills | Status: AC

## 2018-07-24 MED ORDER — SODIUM CHLORIDE 0.9 % IV SOLN
1.0000 g | Freq: Once | INTRAVENOUS | Status: AC
  Administered 2018-07-24: 1 g via INTRAVENOUS
  Filled 2018-07-24: qty 10

## 2018-07-24 MED ORDER — ONDANSETRON HCL 4 MG/2ML IJ SOLN
4.0000 mg | Freq: Once | INTRAMUSCULAR | Status: AC
  Administered 2018-07-24: 4 mg via INTRAVENOUS
  Filled 2018-07-24: qty 2

## 2018-07-24 MED ORDER — MORPHINE SULFATE (PF) 4 MG/ML IV SOLN
4.0000 mg | Freq: Once | INTRAVENOUS | Status: AC
  Administered 2018-07-24: 4 mg via INTRAVENOUS
  Filled 2018-07-24: qty 1

## 2018-07-24 MED ORDER — ACETAMINOPHEN 325 MG PO TABS
650.0000 mg | ORAL_TABLET | Freq: Once | ORAL | Status: AC
  Administered 2018-07-24: 650 mg via ORAL
  Filled 2018-07-24: qty 2

## 2018-07-24 NOTE — Discharge Instructions (Addendum)
As we discussed please take your antibiotic 4 times daily starting tomorrow morning.  You may take your pain medication if needed, but only as prescribed.  Please also use ibuprofen every 6 hours as written on the box for fever or discomfort.  Return to the emergency department if you have any worsening pain, if you become nauseated/vomiting unable to keep down your antibiotics, or any other symptom personally concerning to yourself.  Otherwise please follow-up with your primary care doctor in the next 24 to 48 hours.

## 2018-07-24 NOTE — ED Triage Notes (Addendum)
Pt to triage via w/c with no distress noted; pt reports onset right flank pain hr PTA, pain nonradiating accomp by chills; pt reports she recently had her IUD removed month ago and has been attempting to get pregnant

## 2018-07-24 NOTE — ED Notes (Signed)
Pt given meal tray and water with MD permission.  

## 2018-07-24 NOTE — ED Provider Notes (Addendum)
[facility] Emergency Department Provider Note  Time seen: 8:44 PM  I have reviewed the triage vital signs and the nursing notes.   HISTORY  Chief Complaint Flank Pain    HPI Vanessa Rivera is a 32 y.o. female with a past medical history of hypothyroidism, presents to the emergency department for right flank pain and fever.  According to the patient for the past 2 to 3 days she has had urinary frequency, today developed right flank pain which has progressively worsened throughout the day.  This evening felt very warm, came to the emergency department with a fever of 103 and a heart rate of 164.  Patient denies any nausea, vomiting, diarrhea.  Denies dysuria but states urinary frequency.  States a history of kidney infections in the past which this feels identical.  Denies any vaginal bleeding or discharge.  Describes her pain as moderate, sharp pain located mostly in the right back that radiates to the right lower quadrant.   Past Medical History:  Diagnosis Date  . Goiter   . Hyperthyroidism   . Thyroid disease   . Thyroiditis     Patient Active Problem List   Diagnosis Date Noted  . Goiter 10/08/2017  . Hyperthyroidism 10/08/2017  . Lymphadenopathy 10/08/2017    History reviewed. No pertinent surgical history.  Prior to Admission medications   Medication Sig Start Date End Date Taking? Authorizing Provider  azithromycin (ZITHROMAX) 250 MG tablet Take 1 tablet each day for 4 days starting Monday 02/10/18   Tommi Rumps, PA-C  methimazole (TAPAZOLE) 5 MG tablet Take 3 tablets (15 mg total) by mouth 3 (three) times daily. 08/09/16   Emily Filbert, MD  propranolol (INDERAL) 10 MG tablet Take 1 tablet (10 mg total) by mouth 3 (three) times daily. 10/08/17   Rebecka Apley, MD    Allergies  Allergen Reactions  . Penicillins     Family History  Problem Relation Age of Onset  . Thyroid disease Mother   . Thyroid disease Maternal Aunt    . Prostate cancer Maternal Grandfather     Social History Social History   Tobacco Use  . Smoking status: Current Every Day Smoker    Packs/day: 1.00    Years: 7.00    Pack years: 7.00    Types: Cigarettes  . Smokeless tobacco: Never Used  Substance Use Topics  . Alcohol use: No  . Drug use: No    Review of Systems Constitutional: Positive for fever Eyes: Negative for visual complaints ENT: Negative for recent illness/congestion Cardiovascular: Negative for chest pain. Respiratory: Negative for shortness of breath. Gastrointestinal: Positive for right flank pain.  Negative nausea vomiting diarrhea Genitourinary: Positive for urinary frequency.  Negative for vaginal bleeding or discharge. Musculoskeletal: Negative for musculoskeletal complaints Skin: Negative for skin complaints  Neurological: Negative for headache All other ROS negative  ____________________________________________   PHYSICAL EXAM:  VITAL SIGNS: ED Triage Vitals  Enc Vitals Group     BP 07/24/18 1942 112/61     Pulse Rate 07/24/18 1942 (!) 164     Resp 07/24/18 1942 19     Temp 07/24/18 1942 (!) 103.1 F (39.5 C)     Temp Source 07/24/18 1942 Oral     SpO2 07/24/18 1942 98 %     Weight 07/24/18 1939 117 lb (53.1 kg)     Height 07/24/18 1939 5\' 3"  (1.6 m)     Head Circumference --      Peak  Flow --      Pain Score 07/24/18 1939 8     Pain Loc --      Pain Edu? --      Excl. in GC? --    Constitutional: Alert and oriented. Well appearing and in no distress. Eyes: Normal exam ENT   Head: Normocephalic and atraumatic.   Mouth/Throat: Mucous membranes are moist. Cardiovascular: Normal rate, regular rhythm. No murmur Respiratory: Normal respiratory effort without tachypnea nor retractions. Breath sounds are clear Gastrointestinal: Soft, mild right mid and lower abdominal tenderness to palpation.  Moderate right CVA tenderness to palpation.  No rebound, guarding or  distention. Musculoskeletal: Nontender with normal range of motion in all extremities.  Neurologic:  Normal speech and language. No gross focal neurologic deficits Skin:  Skin is warm, dry and intact.  Psychiatric: Mood and affect are normal.   ____________________________________________   INITIAL IMPRESSION / ASSESSMENT AND PLAN / ED COURSE  Pertinent labs & imaging results that were available during my care of the patient were reviewed by me and considered in my medical decision making (see chart for details).  Patient presents to the emergency department for right flank pain fever and tachycardia.  Given the patient's fever and tachycardia we have initiated code sepsis.  We will start the patient on IV Rocephin for presumed urinary tract infection.  Differential this time would include UTI/pyelonephritis, appendicitis, colitis or diverticulitis, PID although less likely given no vaginal discharge or bleeding.  Patient's work-up is strongly suggestive of pyelonephritis.  Reassuringly patient's white blood cell count is normal, kidney function is normal.  Patient's urinalysis consistent with significant urinary tract infection.  Urine culture has been sent.  We will dose IV Rocephin.  However given the patient's largely normal white blood cell count as well as lactic acid we will continue to closely monitor.  Patient strongly does not want to be admitted to the hospital.    Patient's labs are resulted consistent with a significant urinary tract infection lactate is reassuringly normal, white blood cell count is normal.  I again discussed with the patient continued IV antibiotics and admission to the hospital.  Patient strongly wishes to go home.  As the patient's heart rate is decreasing with fluids currently down to 110 during my evaluation and otherwise reassuring labs I believe this is a reasonable plan of care.  I discussed very strict return precautions with the patient if she develops  worsening pain, becomes nauseated unable to keep down her antibiotics for any reason or her heart rate increases she is to return to the emergency department for repeat evaluation and possible admission to the hospital.  Patient agreeable to plan of care.   EKG reviewed and interpreted by myself appears to show a sinus tachycardia 124 bpm with a narrow QRS, normal axis, largely normal intervals with nonspecific ST changes  ____________________________________________   FINAL CLINICAL IMPRESSION(S) / ED DIAGNOSES  Pyelonephritis    Minna AntisPaduchowski, Markeesha Char, MD 07/24/18 2300    Minna AntisPaduchowski, Redmond Whittley, MD 08/01/18 [phone]

## 2018-07-24 NOTE — Progress Notes (Signed)
CODE SEPSIS - PHARMACY COMMUNICATION  **Broad Spectrum Antibiotics should be administered within 1 hour of Sepsis diagnosis**  Time Code Sepsis Called/Page Received: 8/7 @ 20:17   Antibiotics Ordered: Ceftriaxone 1 gm   Time of 1st antibiotic administration: 8/7 @ 20:59  Additional action taken by pharmacy: none   If necessary, Name of Provider/Nurse Contacted:     Cassandre Oleksy D ,PharmD Clinical Pharmacist  07/24/2018  10:21 PM

## 2018-07-29 LAB — CULTURE, BLOOD (ROUTINE X 2)
CULTURE: NO GROWTH
Culture: NO GROWTH
SPECIAL REQUESTS: ADEQUATE

## 2018-09-25 DIAGNOSIS — F419 Anxiety disorder, unspecified: Secondary | ICD-10-CM | POA: Insufficient documentation

## 2018-09-25 DIAGNOSIS — Z8751 Personal history of pre-term labor: Secondary | ICD-10-CM | POA: Insufficient documentation

## 2018-09-25 DIAGNOSIS — O0990 Supervision of high risk pregnancy, unspecified, unspecified trimester: Secondary | ICD-10-CM | POA: Insufficient documentation

## 2018-09-25 DIAGNOSIS — Z8759 Personal history of other complications of pregnancy, childbirth and the puerperium: Secondary | ICD-10-CM | POA: Insufficient documentation

## 2018-10-08 ENCOUNTER — Emergency Department
Admission: EM | Admit: 2018-10-08 | Discharge: 2018-10-08 | Disposition: A | Discharge status: Home / Self Care | Payer: Medicaid Other | Attending: Emergency Medicine | Admitting: Emergency Medicine

## 2018-10-08 ENCOUNTER — Emergency Department: Payer: Medicaid Other

## 2018-10-08 ENCOUNTER — Other Ambulatory Visit: Payer: Self-pay

## 2018-10-08 DIAGNOSIS — O9989 Other specified diseases and conditions complicating pregnancy, childbirth and the puerperium: Secondary | ICD-10-CM | POA: Insufficient documentation

## 2018-10-08 DIAGNOSIS — M25561 Pain in right knee: Secondary | ICD-10-CM | POA: Diagnosis not present

## 2018-10-08 DIAGNOSIS — O99331 Smoking (tobacco) complicating pregnancy, first trimester: Secondary | ICD-10-CM | POA: Diagnosis not present

## 2018-10-08 DIAGNOSIS — Z3A10 10 weeks gestation of pregnancy: Secondary | ICD-10-CM | POA: Diagnosis not present

## 2018-10-08 DIAGNOSIS — M79604 Pain in right leg: Secondary | ICD-10-CM

## 2018-10-08 DIAGNOSIS — M79661 Pain in right lower leg: Secondary | ICD-10-CM | POA: Diagnosis not present

## 2018-10-08 DIAGNOSIS — M25571 Pain in right ankle and joints of right foot: Secondary | ICD-10-CM | POA: Insufficient documentation

## 2018-10-08 DIAGNOSIS — F17210 Nicotine dependence, cigarettes, uncomplicated: Secondary | ICD-10-CM | POA: Diagnosis not present

## 2018-10-08 NOTE — Discharge Instructions (Addendum)
Follow-up with your regular doctor if not better in 3 to 5 days.  Apply ice to the back of the leg.  Return if worsening.

## 2018-10-08 NOTE — ED Triage Notes (Addendum)
Pt c/o R ankle pain and swelling and pain behind R knee. States sharp in nature. Denies known injury. Pain since this AM. [redacted] weeks pregnant. Denies any pregnancy related complaints. A&O, ambulatory. No distress noted.

## 2018-10-08 NOTE — ED Provider Notes (Signed)
[facility] Emergency Department Provider Note  ____________________________________________   First MD Initiated Contact with Patient 10/08/18 1949     (approximate)  I have reviewed the triage vital signs and the nursing notes.   HISTORY  Chief Complaint Ankle Pain    HPI Vanessa Rivera is a 32 y.o. female presents to the emergency department with pain in the posterior leg.  States pain is behind her knee and radiates to the ankle.   She denies any known injury.  She states that she is [redacted] weeks pregnant and has a history of thyroid disease.  She is just mostly concerned about the pain.   Past Medical History:  Diagnosis Date  . Goiter   . Hyperthyroidism   . Thyroid disease   . Thyroiditis     Patient Active Problem List   Diagnosis Date Noted  . Goiter 10/08/2017  . Hyperthyroidism 10/08/2017  . Lymphadenopathy 10/08/2017    History reviewed. No pertinent surgical history.  Prior to Admission medications   Medication Sig Start Date End Date Taking? Authorizing Provider  HYDROcodone-acetaminophen (NORCO/VICODIN) 5-325 MG tablet Take 1 tablet by mouth every 4 (four) hours as needed. 07/24/18   Minna Antis, MD    Allergies Penicillins  Family History  Problem Relation Age of Onset  . Thyroid disease Mother   . Thyroid disease Maternal Aunt   . Prostate cancer Maternal Grandfather     Social History Social History   Tobacco Use  . Smoking status: Current Every Day Smoker    Packs/day: 1.00    Years: 7.00    Pack years: 7.00    Types: Cigarettes  . Smokeless tobacco: Never Used  Substance Use Topics  . Alcohol use: No  . Drug use: No    Review of Systems  Constitutional: No fever/chills Eyes: No visual changes. ENT: No sore throat. Respiratory: Denies cough Genitourinary: Negative for dysuria. Musculoskeletal: Negative for back pain.  Positive for right leg pain, mostly behind the knee and at the ankle. Skin:  Negative for rash.    ____________________________________________   PHYSICAL EXAM:  VITAL SIGNS: ED Triage Vitals  Enc Vitals Group     BP 10/08/18 1918 (!) 147/72     Pulse Rate 10/08/18 1918 (!) 118     Resp 10/08/18 1918 18     Temp 10/08/18 1918 98.5 F (36.9 C)     Temp Source 10/08/18 1918 Oral     SpO2 10/08/18 1918 100 %     Weight 10/08/18 1919 128 lb (58.1 kg)     Height 10/08/18 1919 5\' 3"  (1.6 m)     Head Circumference --      Peak Flow --      Pain Score 10/08/18 1919 6     Pain Loc --      Pain Edu? --      Excl. in GC? --     Constitutional: Alert and oriented. Well appearing and in no acute distress. Eyes: Conjunctivae are normal.  Head: Atraumatic. Nose: No congestion/rhinnorhea. Mouth/Throat: Mucous membranes are moist.   Neck:  supple no lymphadenopathy noted Cardiovascular: Normal rate, regular rhythm. Heart sounds are normal Respiratory: Normal respiratory effort.  No retractions, lungs c t a  GU: deferred Musculoskeletal: FROM all extremities, warm and well perfused Neurologic:  Normal speech and language.  Skin:  Skin is warm, dry and intact. No rash noted. Psychiatric: Mood and affect are normal. Speech and behavior are normal.  ____________________________________________  LABS (all labs ordered are listed, but only abnormal results are displayed)  Labs Reviewed - No data to display ____________________________________________   ____________________________________________  RADIOLOGY  Ultrasound the right lower extremity is negative for DVT  ____________________________________________   PROCEDURES  Procedure(s) performed: No  Procedures    ____________________________________________   INITIAL IMPRESSION / ASSESSMENT AND PLAN / ED COURSE  Pertinent labs & imaging results that were available during my care of the patient were reviewed by me and considered in my medical decision making (see chart for details).     Patient is 32 year old female presents emergency department complaining of right leg pain.  She is [redacted] weeks pregnant.  On physical exam the right calf, ankle, and posterior knee are tender to palpation.  Ultrasound of the right lower extremity is negative for DVT.  Explained the findings to the patient.  She is to follow with her regular doctor if not better 5 7 days.  Return emergency department worsening.  Apply ice to the area.  She may take Tylenol as needed for pain.  She states she understands will comply.  Was discharged in stable condition.     As part of my medical decision making, I reviewed the following data within the electronic MEDICAL RECORD NUMBER Nursing notes reviewed and incorporated, Old chart reviewed, Radiograph reviewed ultrasound is negative, Notes from prior ED visits and [place] Controlled Substance Database  ____________________________________________   FINAL CLINICAL IMPRESSION(S) / ED DIAGNOSES  Final diagnoses:  Right leg pain      NEW MEDICATIONS STARTED DURING THIS VISIT:  Discharge Medication List as of 10/08/2018  9:23 PM       Note:  This document was prepared using Dragon voice recognition software and may include unintentional dictation errors.    Faythe Ghee, PA-C 10/08/18 2208    Dionne Bucy, MD 10/09/18 [phone]

## 2018-11-25 ENCOUNTER — Encounter: Payer: Self-pay | Admitting: Emergency Medicine

## 2018-11-25 ENCOUNTER — Other Ambulatory Visit: Payer: Self-pay

## 2018-11-25 ENCOUNTER — Emergency Department
Admission: EM | Admit: 2018-11-25 | Discharge: 2018-11-25 | Disposition: A | Discharge status: Home / Self Care | Payer: Medicaid Other | Attending: Emergency Medicine | Admitting: Emergency Medicine

## 2018-11-25 DIAGNOSIS — F17210 Nicotine dependence, cigarettes, uncomplicated: Secondary | ICD-10-CM | POA: Diagnosis not present

## 2018-11-25 DIAGNOSIS — R1030 Lower abdominal pain, unspecified: Secondary | ICD-10-CM | POA: Insufficient documentation

## 2018-11-25 DIAGNOSIS — Z79899 Other long term (current) drug therapy: Secondary | ICD-10-CM | POA: Insufficient documentation

## 2018-11-25 DIAGNOSIS — R197 Diarrhea, unspecified: Secondary | ICD-10-CM

## 2018-11-25 DIAGNOSIS — Z3A17 17 weeks gestation of pregnancy: Secondary | ICD-10-CM | POA: Diagnosis not present

## 2018-11-25 DIAGNOSIS — O26892 Other specified pregnancy related conditions, second trimester: Secondary | ICD-10-CM | POA: Insufficient documentation

## 2018-11-25 DIAGNOSIS — O9989 Other specified diseases and conditions complicating pregnancy, childbirth and the puerperium: Secondary | ICD-10-CM | POA: Diagnosis not present

## 2018-11-25 DIAGNOSIS — O99332 Smoking (tobacco) complicating pregnancy, second trimester: Secondary | ICD-10-CM | POA: Diagnosis not present

## 2018-11-25 DIAGNOSIS — O0992 Supervision of high risk pregnancy, unspecified, second trimester: Secondary | ICD-10-CM | POA: Insufficient documentation

## 2018-11-25 DIAGNOSIS — E079 Disorder of thyroid, unspecified: Secondary | ICD-10-CM | POA: Diagnosis not present

## 2018-11-25 DIAGNOSIS — R110 Nausea: Secondary | ICD-10-CM | POA: Insufficient documentation

## 2018-11-25 LAB — URINALYSIS, COMPLETE (UACMP) WITH MICROSCOPIC
BACTERIA UA: NONE SEEN
BILIRUBIN URINE: NEGATIVE
Glucose, UA: NEGATIVE mg/dL
HGB URINE DIPSTICK: NEGATIVE
Ketones, ur: NEGATIVE mg/dL
LEUKOCYTES UA: NEGATIVE
NITRITE: NEGATIVE
Protein, ur: NEGATIVE mg/dL
SPECIFIC GRAVITY, URINE: 1.02 (ref 1.005–1.030)
pH: 5 (ref 5.0–8.0)

## 2018-11-25 LAB — COMPREHENSIVE METABOLIC PANEL
ALT: 20 U/L (ref 0–44)
AST: 17 U/L (ref 15–41)
Albumin: 3.6 g/dL (ref 3.5–5.0)
Alkaline Phosphatase: 62 U/L (ref 38–126)
Anion gap: 6 (ref 5–15)
BUN: 12 mg/dL (ref 6–20)
CHLORIDE: 107 mmol/L (ref 98–111)
CO2: 21 mmol/L — ABNORMAL LOW (ref 22–32)
CREATININE: 0.44 mg/dL (ref 0.44–1.00)
Calcium: 8.5 mg/dL — ABNORMAL LOW (ref 8.9–10.3)
GFR calc non Af Amer: >60 mL/min (ref >60)
Glucose, Bld: 111 mg/dL — ABNORMAL HIGH (ref 70–99)
POTASSIUM: 4.3 mmol/L (ref 3.5–5.1)
Sodium: 134 mmol/L — ABNORMAL LOW (ref 135–145)
TOTAL PROTEIN: 6.6 g/dL (ref 6.5–8.1)
Total Bilirubin: 0.5 mg/dL (ref 0.3–1.2)

## 2018-11-25 LAB — LIPASE, BLOOD: LIPASE: 22 U/L (ref 11–51)

## 2018-11-25 LAB — CBC
HEMATOCRIT: 40.8 % (ref 36.0–46.0)
Hemoglobin: 13.6 g/dL (ref 12.0–15.0)
MCH: 29.1 pg (ref 26.0–34.0)
MCHC: 33.3 g/dL (ref 30.0–36.0)
MCV: 87.2 fL (ref 80.0–100.0)
NRBC: 0 % (ref 0.0–0.2)
Platelets: 146 10*3/uL — ABNORMAL LOW (ref 150–400)
RBC: 4.68 MIL/uL (ref 3.87–5.11)
RDW: 12.1 % (ref 11.5–15.5)
WBC: 5.6 10*3/uL (ref 4.0–10.5)

## 2018-11-25 MED ORDER — SODIUM CHLORIDE 0.9 % IV BOLUS
1000.0000 mL | Freq: Once | INTRAVENOUS | Status: AC
  Administered 2018-11-25: 1000 mL via INTRAVENOUS

## 2018-11-25 NOTE — ED Notes (Addendum)
Pt called at number listed to inform of left discharge papers. No answer, no voicemail set up. Discharge papers labeled with patient name and left at STAT desk.

## 2018-11-25 NOTE — ED Notes (Signed)
Pt presents to ED from home with c/c of low abdominal/back pain and diarrhea. Pt states Sx began around 9 pm last night. Pain described as "achy and crampy". Diarrhea began shortly after pain onset. Pt reports 4 episodes of diarrhea with most recent being approx 1 hour ago. Stool described as soft and brown. Pt denies any abnormal urinary Sx, vaginal discharge/bleeding. Pt is currently [redacted] wks pregnant. Pt began new prescription of Abilify approx 1 month ago.

## 2018-11-25 NOTE — ED Notes (Signed)
Pt left prior to receiving discharge instructions from this RN.

## 2018-11-25 NOTE — ED Notes (Signed)
Pt not found in stretcher bed.

## 2018-11-25 NOTE — ED Provider Notes (Signed)
[facility] Emergency Department Provider Note  Time seen: 11:47 AM  I have reviewed the triage vital signs and the nursing notes.   HISTORY  Chief Complaint Abdominal Pain and Diarrhea    HPI Vanessa Rivera is a 32 y.o. female with a past medical history of Graves' disease presents to the emergency department for diarrhea.  According to the patient she developed diarrhea last night with occasional lower abdominal cramping largely resolved after diarrhea.  Patient denies any dysuria, hematuria, black or bloody stool, states intermittent nausea but denies any vomiting.  Denies any fever.  Patient is approximately [redacted] weeks pregnant and is high risk due to her Luiz BlareGraves' disease which is what concerned her enough to bring her to the emergency department today.  Denies any abdominal pain at this time.   Past Medical History:  Diagnosis Date  . Goiter   . Hyperthyroidism   . Thyroid disease   . Thyroiditis     Patient Active Problem List   Diagnosis Date Noted  . Goiter 10/08/2017  . Hyperthyroidism 10/08/2017  . Lymphadenopathy 10/08/2017    History reviewed. No pertinent surgical history.  Prior to Admission medications   Medication Sig Start Date End Date Taking? Authorizing Provider  ARIPiprazole (ABILIFY) 5 MG tablet Take 1-2 tablets by mouth daily. Take 1-2 tablets by mouth once daily for 2-3 evenings, then take 1 tablet by mouth once daily in the evening with food 11/08/18  Yes [provider]  lithium 300 MG tablet Take 300 mg by mouth at bedtime. 11/08/18  Yes [provider]  Prenatal Vit-Fe Fumarate-FA (PNV PRENATAL PLUS MULTIVITAMIN) 27-1 MG TABS Take 1 tablet by mouth daily.   Yes [provider]  HYDROcodone-acetaminophen (NORCO/VICODIN) 5-325 MG tablet Take 1 tablet by mouth every 4 (four) hours as needed. Patient not taking: Reported on 11/25/2018 07/24/18   Minna AntisPaduchowski, Malan Werk, MD    Allergies  Allergen Reactions  .  Penicillins     Has patient had a PCN reaction causing immediate rash, facial/tongue/throat swelling, SOB or lightheadedness with hypotension: yes  Has patient had a PCN reaction causing severe rash involving mucus membranes or skin necrosis: no Has patient had a PCN reaction that required hospitalization: yes Has patient had a PCN reaction occurring within the last 10 years: no If all of the above answers are "NO", then may proceed with Cephalosporin use.    Family History  Problem Relation Age of Onset  . Thyroid disease Mother   . Thyroid disease Maternal Aunt   . Prostate cancer Maternal Grandfather     Social History Social History   Tobacco Use  . Smoking status: Current Every Day Smoker    Packs/day: 1.00    Years: 7.00    Pack years: 7.00    Types: Cigarettes  . Smokeless tobacco: Never Used  Substance Use Topics  . Alcohol use: No  . Drug use: No    Review of Systems Constitutional: Negative for fever. Cardiovascular: Negative for chest pain. Respiratory: Negative for shortness of breath. Gastrointestinal: Negative for abdominal pain, although does state intermittent cramping when she has to have diarrhea.  Denies any vomiting but states intermittent nausea Genitourinary: Negative for urinary compaints.  No vaginal bleeding or discharge. Musculoskeletal: Negative for musculoskeletal complaints Skin: Negative for skin complaints  Neurological: Negative for headache All other ROS negative  ____________________________________________   PHYSICAL EXAM:  VITAL SIGNS: ED Triage Vitals  Enc Vitals Group     BP 11/25/18 1000 Marland Kitchen(!)  98/49     Pulse Rate 11/25/18 1000 93     Resp 11/25/18 1000 18     Temp 11/25/18 1000 98.2 F (36.8 C)     Temp Source 11/25/18 1000 Oral     SpO2 11/25/18 1000 98 %     Weight 11/25/18 1001 130 lb (59 kg)     Height 11/25/18 1001 5\' 3"  (1.6 m)     Head Circumference --      Peak Flow --      Pain Score 11/25/18 1005 7     Pain  Loc --      Pain Edu? --      Excl. in GC? --    Constitutional: Alert and oriented. Well appearing and in no distress. Eyes: Normal exam ENT   Head: Normocephalic and atraumatic.   Mouth/Throat: Mucous membranes are moist. Cardiovascular: Normal rate, regular rhythm. No murmur Respiratory: Normal respiratory effort without tachypnea nor retractions. Breath sounds are clear Gastrointestinal: Soft, very minimal left lower quadrant tenderness, no rebound guarding or distention.  Abdomen otherwise benign Musculoskeletal: Nontender with normal range of motion in all extremities. Neurologic:  Normal speech and language. No gross focal neurologic deficits Skin:  Skin is warm, dry and intact.  Psychiatric: Mood and affect are normal.  ____________________________________________   INITIAL IMPRESSION / ASSESSMENT AND PLAN / ED COURSE  Pertinent labs & imaging results that were available during my care of the patient were reviewed by me and considered in my medical decision making (see chart for details).  Patient presents to the emergency department diarrhea since last night.  No significant abdominal pain or discomfort.  Negative review of systems including no vomiting, no dysuria, hematuria, vaginal bleeding or discharge.  Patient states she came to the hospital because she is pregnant did not want to become dehydrated.  We will IV hydrate, and perform a bedside ultrasound to evaluate.  Patient's lab work is largely nonrevealing with a normal white blood cell count, normal urinalysis.  Do not believe the patient requires further imaging at this time highly suspect viral process.  Patient is feeling much better after fluids.  Bedside ultrasound performed by myself shows good fetal movement with a good amount of amniotic fluid, fetal heart rate of 146 bpm currently.  ____________________________________________   FINAL CLINICAL IMPRESSION(S) / ED DIAGNOSES  Diarrhea    Minna Antis, MD 11/25/18 1500

## 2018-11-25 NOTE — ED Triage Notes (Signed)
Here for generalized abdominal pain and diarrhea since yesterday. Diarrhea X 4 today. No vomiting. No vaginal bleeding. Feels like has UTI. No fevers. VSS. Unlabored.    FHT 158 via doppler

## 2018-12-12 ENCOUNTER — Emergency Department
Admission: EM | Admit: 2018-12-12 | Discharge: 2018-12-12 | Disposition: A | Discharge status: Home / Self Care | Payer: Medicaid Other | Attending: Emergency Medicine | Admitting: Emergency Medicine

## 2018-12-12 ENCOUNTER — Other Ambulatory Visit: Payer: Self-pay

## 2018-12-12 ENCOUNTER — Emergency Department: Payer: Medicaid Other

## 2018-12-12 DIAGNOSIS — R1031 Right lower quadrant pain: Secondary | ICD-10-CM | POA: Diagnosis not present

## 2018-12-12 DIAGNOSIS — O26892 Other specified pregnancy related conditions, second trimester: Secondary | ICD-10-CM | POA: Diagnosis not present

## 2018-12-12 DIAGNOSIS — O4690 Antepartum hemorrhage, unspecified, unspecified trimester: Secondary | ICD-10-CM

## 2018-12-12 DIAGNOSIS — O209 Hemorrhage in early pregnancy, unspecified: Secondary | ICD-10-CM | POA: Insufficient documentation

## 2018-12-12 DIAGNOSIS — Z3A19 19 weeks gestation of pregnancy: Secondary | ICD-10-CM | POA: Diagnosis not present

## 2018-12-12 DIAGNOSIS — F17210 Nicotine dependence, cigarettes, uncomplicated: Secondary | ICD-10-CM | POA: Diagnosis not present

## 2018-12-12 DIAGNOSIS — O99332 Smoking (tobacco) complicating pregnancy, second trimester: Secondary | ICD-10-CM | POA: Diagnosis not present

## 2018-12-12 LAB — BASIC METABOLIC PANEL
Anion gap: 6 (ref 5–15)
BUN: 11 mg/dL (ref 6–20)
CO2: 23 mmol/L (ref 22–32)
Calcium: 8.7 mg/dL — ABNORMAL LOW (ref 8.9–10.3)
Chloride: 107 mmol/L (ref 98–111)
Creatinine, Ser: <0.3 mg/dL — ABNORMAL LOW (ref 0.44–1.00)
Glucose, Bld: 97 mg/dL (ref 70–99)
Potassium: 4.1 mmol/L (ref 3.5–5.1)
Sodium: 136 mmol/L (ref 135–145)

## 2018-12-12 LAB — CBC
HCT: 35.3 % — ABNORMAL LOW (ref 36.0–46.0)
Hemoglobin: 11.7 g/dL — ABNORMAL LOW (ref 12.0–15.0)
MCH: 29 pg (ref 26.0–34.0)
MCHC: 33.1 g/dL (ref 30.0–36.0)
MCV: 87.6 fL (ref 80.0–100.0)
Platelets: 166 10*3/uL (ref 150–400)
RBC: 4.03 MIL/uL (ref 3.87–5.11)
RDW: 12.3 % (ref 11.5–15.5)
WBC: 7.4 10*3/uL (ref 4.0–10.5)
nRBC: 0 % (ref 0.0–0.2)

## 2018-12-12 LAB — HCG, QUANTITATIVE, PREGNANCY: hCG, Beta Chain, Quant, S: 11107 m[IU]/mL — ABNORMAL HIGH (ref <5)

## 2018-12-12 NOTE — ED Notes (Signed)
Urine collected and sent to lab.  Patient to ultrasound

## 2018-12-12 NOTE — ED Provider Notes (Signed)
[facility] Emergency Department Provider Note   ____________________________________________    I have reviewed the triage vital signs and the nursing notes.   HISTORY  Chief Complaint Vaginal Bleeding     HPI Vanessa Rivera is a 32 y.o. female who reports she had small amount of brownish-reddish vaginal bleeding today with vague right lower quadrant cramping which is now resolved.  No further bleeding.  Review of care everywhere demonstrates the patient is Rh+.  [facility] due to Graves' disease and history of drug abuse.  No fevers or chills.  No current abdominal pain.  No current vaginal bleeding.  Past Medical History:  Diagnosis Date  . Goiter   . Hyperthyroidism   . Thyroid disease   . Thyroiditis     Patient Active Problem List   Diagnosis Date Noted  . Goiter 10/08/2017  . Hyperthyroidism 10/08/2017  . Lymphadenopathy 10/08/2017    History reviewed. No pertinent surgical history.  Prior to Admission medications   Medication Sig Start Date End Date Taking? Authorizing Provider  ARIPiprazole (ABILIFY) 5 MG tablet Take 1-2 tablets by mouth daily. Take 1-2 tablets by mouth once daily for 2-3 evenings, then take 1 tablet by mouth once daily in the evening with food 11/08/18   [provider]  HYDROcodone-acetaminophen (NORCO/VICODIN) 5-325 MG tablet Take 1 tablet by mouth every 4 (four) hours as needed. Patient not taking: Reported on 11/25/2018 07/24/18   Minna AntisPaduchowski, Kevin, MD  hydroxyprogesterone caproate (MAKENA) 250 mg/mL OIL injection Inject 1 mL into the muscle once a week. 09/25/18 03/31/19  [provider]  lithium 300 MG tablet Take 300 mg by mouth at bedtime. 11/08/18   [provider]  Prenatal Vit-Fe Fumarate-FA (PNV PRENATAL PLUS MULTIVITAMIN) 27-1 MG TABS Take 1 tablet by mouth daily.    [provider]     Allergies Penicillins  Family  History  Problem Relation Age of Onset  . Thyroid disease Mother   . Thyroid disease Maternal Aunt   . Prostate cancer Maternal Grandfather     Social History Social History   Tobacco Use  . Smoking status: Current Every Day Smoker    Packs/day: 1.00    Years: 7.00    Pack years: 7.00    Types: Cigarettes  . Smokeless tobacco: Never Used  Substance Use Topics  . Alcohol use: No  . Drug use: No    Review of Systems  Constitutional: No fever/chills Eyes: No visual changes.  ENT: No sore throat. Cardiovascular: Denies chest pain. Respiratory: Denies shortness of breath. Gastrointestinal: As above Genitourinary: As above Musculoskeletal: Negative for back pain. Skin: Negative for rash. Neurological: Negative for headaches or weakness   ____________________________________________   PHYSICAL EXAM:  VITAL SIGNS: ED Triage Vitals  Enc Vitals Group     BP 12/12/18 1202 (!) 114/58     Pulse Rate 12/12/18 1202 100     Resp 12/12/18 1202 16     Temp 12/12/18 1202 98.2 F (36.8 C)     Temp Source 12/12/18 1202 Oral     SpO2 12/12/18 1202 100 %     Weight 12/12/18 1203 61.7 kg (136 lb)     Height 12/12/18 1203 1.6 m (5\' 3" )     Head Circumference --      Peak Flow --      Pain Score 12/12/18 1202 3     Pain Loc --  Pain Edu? --      Excl. in GC? --     Constitutional: Alert and oriented. No acute distress. Eyes: Conjunctivae are normal.   Nose: No congestion/rhinnorhea. Mouth/Throat: Mucous membranes are moist.    Cardiovascular: Normal rate, regular rhythm. Grossly normal heart sounds.  Good peripheral circulation. Respiratory: Normal respiratory effort.  No retractions. Lungs CTAB. Gastrointestinal: Soft and nontender. No distention.  No CVA tenderness.  Musculoskeletal: .  Warm and well perfused Neurologic:  Normal speech and language. No gross focal neurologic deficits are appreciated.  Skin:  Skin is warm, dry and intact. No rash  noted. Psychiatric: Mood and affect are normal. Speech and behavior are normal.  ____________________________________________   LABS (all labs ordered are listed, but only abnormal results are displayed)  Labs Reviewed  HCG, QUANTITATIVE, PREGNANCY - Abnormal; Notable for the following components:      Result Value   hCG, Beta Chain, Quant, S 11,107 (*)    All other components within normal limits  BASIC METABOLIC PANEL - Abnormal; Notable for the following components:   Creatinine, Ser <0.30 (*)    Calcium 8.7 (*)    All other components within normal limits  CBC - Abnormal; Notable for the following components:   Hemoglobin 11.7 (*)    HCT 35.3 (*)    All other components within normal limits   ____________________________________________  EKG  None ____________________________________________  RADIOLOGY  Ultrasound unremarkable ____________________________________________   PROCEDURES  Procedure(s) performed: No  Procedures   Critical Care performed: No ____________________________________________   INITIAL IMPRESSION / ASSESSMENT AND PLAN / ED COURSE  Pertinent labs & imaging results that were available during my care of the patient were reviewed by me and considered in my medical decision making (see chart for details).  Patient well-appearing and in no acute distress.  She is Rh+.  Lab work is unremarkable, ultrasound is reassuring.  She feels better knowing ultrasound results, appropriate for discharge with outpatient follow-up with her OB    ____________________________________________   FINAL CLINICAL IMPRESSION(S) / ED DIAGNOSES  Final diagnoses:  Vaginal bleeding in pregnancy        Note:  This document was prepared using Dragon voice recognition software and may include unintentional dictation errors.    Jene EveryKinner, Devynn Hessler, MD 12/12/18 [phone]

## 2018-12-12 NOTE — ED Triage Notes (Signed)
Pt c/o brown vaginal bleeding noted today with RLQ pain. States she is [redacted] weeks pregnant. Pt of [facility]

## 2018-12-18 NOTE — L&D Delivery Note (Signed)
Obstetrical Delivery Note   Date of Delivery:   04/07/2019 Primary OB:   [facility] Gestational Age/EDD: [redacted]w[redacted]d (Dated by LMP=8wk) Antepartum complications: amphetamine abuse, polyhydramnios,  Hypertension, mood disorder, tobbaco use, hx of preterm delivery  Delivered By:   Farrel Conners, CNM  Delivery Type:   spontaneous vaginal delivery  Procedure Details:   CTSP with urge to push and anterior lip. Mother pushed to deliver a viable female infant with nuchal cord x 1, reduced on perineum. Baby placed on mother's abdomen and dried and stimulated. After a delayed cord clamping, the cord was clamped and then cut by FOB. Placenta and 3 vessel cord delivered spontaneously. No vaginal, cervical, or perineal lacerations seen. Anesthesia:    none Intrapartum complications: Abruptio Placenta, Preterm Labor and PPROM. MSAF, fetal tachycardia, maternal tachycardia GBS:    negative Laceration:    none Episiotomy:    none Placenta:    Via active 3rd stage. To pathology: yes Estimated Blood Loss:  400 (before and with delivery Baby:    Liveborn female, Apgars 8/9, weight 7#3oz    Farrel Conners, CNM

## 2019-02-11 ENCOUNTER — Emergency Department: Payer: Medicaid Other

## 2019-02-11 ENCOUNTER — Encounter: Payer: Self-pay | Admitting: Emergency Medicine

## 2019-02-11 ENCOUNTER — Other Ambulatory Visit: Payer: Self-pay

## 2019-02-11 ENCOUNTER — Emergency Department
Admission: EM | Admit: 2019-02-11 | Discharge: 2019-02-11 | Disposition: A | Discharge status: Other Acute Inpatient Hospital | Payer: Medicaid Other | Attending: Emergency Medicine | Admitting: Emergency Medicine

## 2019-02-11 DIAGNOSIS — Z3A27 27 weeks gestation of pregnancy: Secondary | ICD-10-CM | POA: Insufficient documentation

## 2019-02-11 DIAGNOSIS — O9989 Other specified diseases and conditions complicating pregnancy, childbirth and the puerperium: Secondary | ICD-10-CM | POA: Insufficient documentation

## 2019-02-11 DIAGNOSIS — O99512 Diseases of the respiratory system complicating pregnancy, second trimester: Secondary | ICD-10-CM | POA: Insufficient documentation

## 2019-02-11 DIAGNOSIS — A419 Sepsis, unspecified organism: Secondary | ICD-10-CM | POA: Diagnosis not present

## 2019-02-11 DIAGNOSIS — F17210 Nicotine dependence, cigarettes, uncomplicated: Secondary | ICD-10-CM | POA: Diagnosis not present

## 2019-02-11 DIAGNOSIS — R0602 Shortness of breath: Secondary | ICD-10-CM | POA: Insufficient documentation

## 2019-02-11 DIAGNOSIS — J069 Acute upper respiratory infection, unspecified: Secondary | ICD-10-CM

## 2019-02-11 DIAGNOSIS — O99332 Smoking (tobacco) complicating pregnancy, second trimester: Secondary | ICD-10-CM | POA: Diagnosis not present

## 2019-02-11 DIAGNOSIS — O99412 Diseases of the circulatory system complicating pregnancy, second trimester: Secondary | ICD-10-CM | POA: Insufficient documentation

## 2019-02-11 DIAGNOSIS — R000 Tachycardia, unspecified: Secondary | ICD-10-CM

## 2019-02-11 LAB — URINALYSIS, ROUTINE W REFLEX MICROSCOPIC
Glucose, UA: NEGATIVE mg/dL
Hgb urine dipstick: NEGATIVE
Ketones, ur: NEGATIVE mg/dL
Nitrite: POSITIVE — AB
Protein, ur: 30 mg/dL — AB
SPECIFIC GRAVITY, URINE: 1.025 (ref 1.005–1.030)
pH: 6 (ref 5.0–8.0)

## 2019-02-11 LAB — COMPREHENSIVE METABOLIC PANEL
ALT: 42 U/L (ref 0–44)
ANION GAP: 8 (ref 5–15)
AST: 55 U/L — ABNORMAL HIGH (ref 15–41)
Albumin: 2.8 g/dL — ABNORMAL LOW (ref 3.5–5.0)
Alkaline Phosphatase: 112 U/L (ref 38–126)
BUN: 9 mg/dL (ref 6–20)
CO2: 20 mmol/L — ABNORMAL LOW (ref 22–32)
Calcium: 8.2 mg/dL — ABNORMAL LOW (ref 8.9–10.3)
Chloride: 108 mmol/L (ref 98–111)
Creatinine, Ser: <0.3 mg/dL — ABNORMAL LOW (ref 0.44–1.00)
Glucose, Bld: 103 mg/dL — ABNORMAL HIGH (ref 70–99)
Potassium: 3.7 mmol/L (ref 3.5–5.1)
Sodium: 136 mmol/L (ref 135–145)
Total Bilirubin: 1.2 mg/dL (ref 0.3–1.2)
Total Protein: 5.8 g/dL — ABNORMAL LOW (ref 6.5–8.1)

## 2019-02-11 LAB — LIPASE, BLOOD: Lipase: 18 U/L (ref 11–51)

## 2019-02-11 LAB — INFLUENZA PANEL BY PCR (TYPE A & B)
INFLBPCR: NEGATIVE
Influenza A By PCR: NEGATIVE

## 2019-02-11 LAB — URINE DRUG SCREEN, QUALITATIVE (ARMC ONLY)
AMPHETAMINES, UR SCREEN: NOT DETECTED
Barbiturates, Ur Screen: NOT DETECTED
Benzodiazepine, Ur Scrn: NOT DETECTED
Cannabinoid 50 Ng, Ur ~~LOC~~: NOT DETECTED
Cocaine Metabolite,Ur ~~LOC~~: NOT DETECTED
MDMA (ECSTASY) UR SCREEN: NOT DETECTED
Methadone Scn, Ur: NOT DETECTED
Opiate, Ur Screen: NOT DETECTED
Phencyclidine (PCP) Ur S: NOT DETECTED
Tricyclic, Ur Screen: NOT DETECTED

## 2019-02-11 LAB — CBC
HEMATOCRIT: 31.3 % — AB (ref 36.0–46.0)
Hemoglobin: 10.5 g/dL — ABNORMAL LOW (ref 12.0–15.0)
MCH: 28.1 pg (ref 26.0–34.0)
MCHC: 33.5 g/dL (ref 30.0–36.0)
MCV: 83.7 fL (ref 80.0–100.0)
Platelets: 135 10*3/uL — ABNORMAL LOW (ref 150–400)
RBC: 3.74 MIL/uL — ABNORMAL LOW (ref 3.87–5.11)
RDW: 12.7 % (ref 11.5–15.5)
WBC: 4.1 10*3/uL (ref 4.0–10.5)
nRBC: 0 % (ref 0.0–0.2)

## 2019-02-11 LAB — TSH: TSH: <0.01 u[IU]/mL — ABNORMAL LOW (ref 0.350–4.500)

## 2019-02-11 LAB — LACTIC ACID, PLASMA: LACTIC ACID, VENOUS: 0.8 mmol/L (ref 0.5–1.9)

## 2019-02-11 LAB — BRAIN NATRIURETIC PEPTIDE: B Natriuretic Peptide: 164 pg/mL — ABNORMAL HIGH (ref 0.0–100.0)

## 2019-02-11 MED ORDER — METHYLPREDNISOLONE SODIUM SUCC 125 MG IJ SOLR
125.0000 mg | Freq: Once | INTRAMUSCULAR | Status: AC
  Administered 2019-02-11: 125 mg via INTRAVENOUS
  Filled 2019-02-11: qty 2

## 2019-02-11 MED ORDER — SODIUM CHLORIDE 0.9 % IV SOLN
1.0000 g | Freq: Once | INTRAVENOUS | Status: AC
  Administered 2019-02-11: 1 g via INTRAVENOUS
  Filled 2019-02-11: qty 10

## 2019-02-11 MED ORDER — SODIUM CHLORIDE 0.9 % IV BOLUS
1000.0000 mL | Freq: Once | INTRAVENOUS | Status: AC
  Administered 2019-02-11: 1000 mL via INTRAVENOUS

## 2019-02-11 MED ORDER — ONDANSETRON HCL 4 MG/2ML IJ SOLN
4.0000 mg | Freq: Once | INTRAMUSCULAR | Status: AC
  Administered 2019-02-11: 4 mg via INTRAVENOUS
  Filled 2019-02-11: qty 2

## 2019-02-11 MED ORDER — IPRATROPIUM-ALBUTEROL 0.5-2.5 (3) MG/3ML IN SOLN
3.0000 mL | Freq: Once | RESPIRATORY_TRACT | Status: AC
  Administered 2019-02-11: 3 mL via RESPIRATORY_TRACT
  Filled 2019-02-11: qty 3

## 2019-02-11 MED ORDER — SODIUM CHLORIDE 0.9 % IV BOLUS
500.0000 mL | Freq: Once | INTRAVENOUS | Status: AC
  Administered 2019-02-11: 500 mL via INTRAVENOUS

## 2019-02-11 MED ORDER — ACETAMINOPHEN 500 MG PO TABS
1000.0000 mg | ORAL_TABLET | Freq: Once | ORAL | Status: AC
  Administered 2019-02-11: 1000 mg via ORAL
  Filled 2019-02-11: qty 2

## 2019-02-11 MED ORDER — LEVOFLOXACIN IN D5W 750 MG/150ML IV SOLN: 750.0000 mg | Freq: Once | INTRAVENOUS | Status: DC

## 2019-02-11 MED ORDER — SODIUM CHLORIDE 0.9 % IV SOLN
500.0000 mg | Freq: Once | INTRAVENOUS | Status: AC
  Administered 2019-02-11: 500 mg via INTRAVENOUS
  Filled 2019-02-11: qty 500

## 2019-02-11 NOTE — ED Notes (Signed)
Darl Pikes from [facility] called stating patient is waiting for bed assignment 1955

## 2019-02-11 NOTE — ED Notes (Signed)
EMTALA reviewed by this RN.  

## 2019-02-11 NOTE — ED Notes (Signed)
Called [facility] to transport ED to [facility] ED 2052

## 2019-02-11 NOTE — ED Notes (Signed)
Called [facility] transfer Center spoke to [facility]

## 2019-02-11 NOTE — ED Notes (Signed)
Spoke with MD Alphonzo Lemmings , see orders

## 2019-02-11 NOTE — ED Notes (Signed)
Called [facility] to give report, they asked to call this RN back. I will call hospital back in 10 minutes if they have not called back first.

## 2019-02-11 NOTE — ED Provider Notes (Addendum)
[facility] Emergency Department Provider Note  ____________________________________________  Time seen: Approximately 7:07 PM  I have reviewed the triage vital signs and the nursing notes.   HISTORY  Chief Complaint Generalized Body Aches    HPI Vanessa Rivera is a 33 y.o. female G3, P2 approximately [redacted] weeks pregnant with a history of Graves' disease on methimazole, tobacco use, followed at [facility] for high risk pregnancy but no current complications in the pregnancy, presenting with 3 days of progressively worsening cough and shortness of breath, generalized malaise, body aches, nausea without vomiting.  The patient reports normal fetal movement, without any gush of fluid or vaginal bleeding.  She has not had any fevers or chills, diarrhea or abdominal pain.  She is no history of travel outside the [place] or exposure to anyone with travel outside the [place].  She did get her influenza vaccination this year.   Past Medical History:  Diagnosis Date  . Goiter   . Hyperthyroidism   . Thyroid disease   . Thyroiditis     Patient Active Problem List   Diagnosis Date Noted  . Goiter 10/08/2017  . Hyperthyroidism 10/08/2017  . Lymphadenopathy 10/08/2017    History reviewed. No pertinent surgical history.  Current Outpatient Rx  . Order #: [id number] Class: Historical Med  . Order #: [id number] Class: Normal  . Order #: [id number] Class: Historical Med  . Order #: [id number] Class: Historical Med  . Order #: [id number] Class: Historical Med    Allergies Penicillins  Family History  Problem Relation Age of Onset  . Thyroid disease Mother   . Thyroid disease Maternal Aunt   . Prostate cancer Maternal Grandfather     Social History Social History   Tobacco Use  . Smoking status: Current Every Day Smoker    Packs/day: 1.00    Years: 7.00    Pack years: 7.00    Types: Cigarettes  . Smokeless tobacco: Never Used  Substance Use Topics  .  Alcohol use: No  . Drug use: No    Review of Systems Constitutional: No fever/chills. + Body aches and general malaise. Eyes: No visual changes. ENT: No sore throat.  Positive congestion and rhinorrhea.  No ear pain. Cardiovascular: Denies chest pain. Denies palpitations. Respiratory: Positive wheezing and shortness of breath.  Positive cough. Gastrointestinal: No abdominal pain.  Acid of nausea, no vomiting.  No diarrhea.  No constipation. Genitourinary: Negative for dysuria. Musculoskeletal: Negative for back pain.  No lower extremity swelling. Skin: Negative for rash. Neurological: Negative for headaches. No focal numbness, tingling or weakness.     ____________________________________________   PHYSICAL EXAM:  VITAL SIGNS: ED Triage Vitals  Enc Vitals Group     BP 02/11/19 1549 (!) 163/70     Pulse Rate 02/11/19 1549 (!) 124     Resp 02/11/19 1549 (!) 24     Temp 02/11/19 1549 98.2 F (36.8 C)     Temp Source 02/11/19 1549 Oral     SpO2 02/11/19 1549 97 %     Weight 02/11/19 1811 150 lb (68 kg)     Height 02/11/19 1811 5\' 3"  (1.6 m)     Head Circumference --      Peak Flow --      Pain Score 02/11/19 1550 7     Pain Loc --      Pain Edu? --      Excl. in GC? --     Constitutional: Alert and oriented. Answers questions appropriately.  Uncomfortable appearing.  Eyes: Conjunctivae injected on the left.  Normal on the right.  No significant proptosis.  EOMI. No scleral icterus. Head: Atraumatic. Nose: No congestion/rhinnorhea. Mouth/Throat: Mucous membranes are mildly dry.  Neck: No stridor.  Supple.   Cardiovascular: Normal rate, regular rhythm. No murmurs, rubs or gallops.  Respiratory: Patient is tachypneic with mild accessory muscle use but no retractions.  She is able to speak in full sentences without difficulty.  She has inspiratory and expiratory wheezing without rales or rhonchi.  Oxygen saturation is 100% on room air on my exam. Gastrointestinal: Soft,  nontender and nondistended.  The patient is gravid to several centimeters above the umbilicus without any tenderness to palpation.  No guarding or rebound.  No peritoneal signs. Musculoskeletal: No LE edema.  Neurologic:  A&Ox3.  Speech is clear.  Face and smile are symmetric.  EOMI.  Moves all extremities well. Skin:  Skin is warm, dry and intact. No rash noted. Psychiatric: Mood and affect are normal.   ____________________________________________   LABS (all labs ordered are listed, but only abnormal results are displayed)  Labs Reviewed  COMPREHENSIVE METABOLIC PANEL - Abnormal; Notable for the following components:      Result Value   CO2 20 (*)    Glucose, Bld 103 (*)    Creatinine, Ser <0.30 (*)    Calcium 8.2 (*)    Total Protein 5.8 (*)    Albumin 2.8 (*)    AST 55 (*)    All other components within normal limits  CBC - Abnormal; Notable for the following components:   RBC 3.74 (*)    Hemoglobin 10.5 (*)    HCT 31.3 (*)    Platelets 135 (*)    All other components within normal limits  TSH - Abnormal; Notable for the following components:   TSH <0.010 (*)    All other components within normal limits  CULTURE, BLOOD (ROUTINE X 2)  CULTURE, BLOOD (ROUTINE X 2)  LIPASE, BLOOD  INFLUENZA PANEL BY PCR (TYPE A & B)  LACTIC ACID, PLASMA  LACTIC ACID, PLASMA  BRAIN NATRIURETIC PEPTIDE  BLOOD GAS, VENOUS  URINALYSIS, ROUTINE W REFLEX MICROSCOPIC  URINE DRUG SCREEN, QUALITATIVE ([facility] ONLY)   ____________________________________________  EKG  ED ECG REPORT I, Anne-Caroline Sharma Covert, the attending physician, personally viewed and interpreted this ECG.   Date: 02/11/2019  EKG Time: 1555  Rate: 131  Rhythm: sinus tachycardia  Axis: normal  Intervals:none  ST&T Change: No STEMI  ____________________________________________  RADIOLOGY  No results found.  ____________________________________________   PROCEDURES  Procedure(s) performed:  None  Procedures  Critical Care performed: Yes, see critical care note(s) ____________________________________________   INITIAL IMPRESSION / ASSESSMENT AND PLAN / ED COURSE  Pertinent labs & imaging results that were available during my care of the patient were reviewed by me and considered in my medical decision making (see chart for details).  33 y.o. female G3, P2 approximately is [redacted] weeks pregnant presenting with generalized malaise, myalgias, cough and congestion, nausea without vomiting.  Overall, I am concerned about this patient's pulmonary examination as well as her significant tachycardia.  She has received 1.2 L of fluids with some minimal improvement in her heart rate will continue with fluid resuscitation.  She will also receive empiric antibiotics for pneumonia; her chest x-ray is pending.  Her influenza testing here is negative and she did receive her flu vaccination this year.  Blood cultures, urinalysis have been sent.  The patient will receive a DuoNeb with Solu-Medrol for her  wheezing.  The patient has an undetectable TSH, which appears to be baseline for her.  Well infectious etiology is the most likely driver of her tachycardia, I have considered thyroid storm, which is less likely.  PE has also been considered but again less likely in the setting of this infectious process; there is no clinical suggestion of DVT.  Intrapartum congestive heart failure is also considered but again the patient has normal blood pressure, no evidence of lower extremity swelling/edema.  I have spoken with Dr. Crista Curb, the [facility] attending in triage today and the patient has been accepted for transfer to [facility] triage.  Patient does not have a history of polysubstance abuse and certainly if she has been using cocaine or other drugs, this may drive her heart rate up.  Urine drug screen has been added to her laboratory studies.  Her his report, her urine drug testing from January was  negative.  ----------------------------------------- 8:50 PM on 02/11/2019 -----------------------------------------  I have just talked to the ER physician at [facility], and per protocol they will transfer the patient to the [facility] ER rather than OB triage.  CRITICAL CARE Performed by: Rockne Menghini   Total critical care time: 45 minutes  Critical care time was exclusive of separately billable procedures and treating other patients.  Critical care was necessary to treat or prevent imminent or life-threatening deterioration.  Critical care was time spent personally by me on the following activities: development of treatment plan with patient and/or surrogate as well as nursing, discussions with consultants, evaluation of patient's response to treatment, examination of patient, obtaining history from patient or surrogate, ordering and performing treatments and interventions, ordering and review of laboratory studies, ordering and review of radiographic studies, pulse oximetry and re-evaluation of patient's condition.   ____________________________________________  FINAL CLINICAL IMPRESSION(S) / ED DIAGNOSES  Final diagnoses:  Sepsis, due to unspecified organism, unspecified whether acute organ dysfunction present [facility])         NEW MEDICATIONS STARTED DURING THIS VISIT:  New Prescriptions   No medications on file      Rockne Menghini, MD 02/11/19 Serena Croissant    Rockne Menghini, MD 02/11/19 1929    Rockne Menghini, MD 02/11/19 2050

## 2019-02-11 NOTE — ED Triage Notes (Signed)
PT to ED via POV with c/o flu like symptoms, cough, fever, body aches x few days. PT is 27wks preg. HR 130

## 2019-02-16 LAB — CULTURE, BLOOD (ROUTINE X 2)
Culture: NO GROWTH
Culture: NO GROWTH
Special Requests: ADEQUATE
Special Requests: ADEQUATE

## 2019-04-06 ENCOUNTER — Encounter: Payer: Self-pay | Admitting: Anesthesiology

## 2019-04-06 ENCOUNTER — Inpatient Hospital Stay
Admission: EM | Admit: 2019-04-06 | Discharge: 2019-04-09 | DRG: 806 | Disposition: A | Discharge status: Home / Self Care | Payer: Medicaid Other | Attending: Certified Nurse Midwife | Admitting: Certified Nurse Midwife

## 2019-04-06 ENCOUNTER — Other Ambulatory Visit: Payer: Self-pay

## 2019-04-06 DIAGNOSIS — O42913 Preterm premature rupture of membranes, unspecified as to length of time between rupture and onset of labor, third trimester: Secondary | ICD-10-CM | POA: Diagnosis present

## 2019-04-06 DIAGNOSIS — F1510 Other stimulant abuse, uncomplicated: Secondary | ICD-10-CM | POA: Diagnosis present

## 2019-04-06 DIAGNOSIS — O42919 Preterm premature rupture of membranes, unspecified as to length of time between rupture and onset of labor, unspecified trimester: Secondary | ICD-10-CM | POA: Diagnosis present

## 2019-04-06 DIAGNOSIS — R000 Tachycardia, unspecified: Secondary | ICD-10-CM | POA: Diagnosis present

## 2019-04-06 DIAGNOSIS — O403XX0 Polyhydramnios, third trimester, not applicable or unspecified: Secondary | ICD-10-CM | POA: Diagnosis present

## 2019-04-06 DIAGNOSIS — O4593 Premature separation of placenta, unspecified, third trimester: Principal | ICD-10-CM | POA: Diagnosis present

## 2019-04-06 DIAGNOSIS — O99324 Drug use complicating childbirth: Secondary | ICD-10-CM | POA: Diagnosis present

## 2019-04-06 DIAGNOSIS — F39 Unspecified mood [affective] disorder: Secondary | ICD-10-CM | POA: Diagnosis present

## 2019-04-06 DIAGNOSIS — F17210 Nicotine dependence, cigarettes, uncomplicated: Secondary | ICD-10-CM | POA: Diagnosis present

## 2019-04-06 DIAGNOSIS — O4590 Premature separation of placenta, unspecified, unspecified trimester: Secondary | ICD-10-CM | POA: Diagnosis present

## 2019-04-06 DIAGNOSIS — Z3A35 35 weeks gestation of pregnancy: Secondary | ICD-10-CM | POA: Diagnosis not present

## 2019-04-06 DIAGNOSIS — O26893 Other specified pregnancy related conditions, third trimester: Secondary | ICD-10-CM | POA: Diagnosis present

## 2019-04-06 DIAGNOSIS — Z880 Allergy status to penicillin: Secondary | ICD-10-CM | POA: Diagnosis not present

## 2019-04-06 DIAGNOSIS — O99344 Other mental disorders complicating childbirth: Secondary | ICD-10-CM | POA: Diagnosis present

## 2019-04-06 DIAGNOSIS — O4690 Antepartum hemorrhage, unspecified, unspecified trimester: Secondary | ICD-10-CM | POA: Diagnosis present

## 2019-04-06 DIAGNOSIS — O99334 Smoking (tobacco) complicating childbirth: Secondary | ICD-10-CM | POA: Diagnosis present

## 2019-04-06 DIAGNOSIS — O99284 Endocrine, nutritional and metabolic diseases complicating childbirth: Secondary | ICD-10-CM | POA: Diagnosis not present

## 2019-04-06 DIAGNOSIS — Z3A36 36 weeks gestation of pregnancy: Secondary | ICD-10-CM | POA: Diagnosis not present

## 2019-04-06 HISTORY — DX: Other stimulant dependence, uncomplicated: F15.20

## 2019-04-06 HISTORY — DX: Smoking (tobacco) complicating pregnancy, third trimester: O99.333

## 2019-04-06 HISTORY — DX: Anxiety disorder, unspecified: F41.9

## 2019-04-06 HISTORY — DX: Antepartum hemorrhage, unspecified, unspecified trimester: O46.90

## 2019-04-06 HISTORY — DX: Depression, unspecified: F32.A

## 2019-04-06 HISTORY — DX: Major depressive disorder, single episode, unspecified: F32.9

## 2019-04-06 LAB — PROTEIN / CREATININE RATIO, URINE
Creatinine, Urine: 121 mg/dL
Protein Creatinine Ratio: 1.07 mg/mg{Cre} — ABNORMAL HIGH (ref 0.00–0.15)
Total Protein, Urine: 129 mg/dL

## 2019-04-06 LAB — PROTIME-INR
INR: 1.1 (ref 0.8–1.2)
Prothrombin Time: 13.7 seconds (ref 11.4–15.2)

## 2019-04-06 LAB — ABO/RH: ABO/RH(D): O POS

## 2019-04-06 LAB — HEPATIC FUNCTION PANEL
ALT: 17 U/L (ref 0–44)
AST: 21 U/L (ref 15–41)
Albumin: 3 g/dL — ABNORMAL LOW (ref 3.5–5.0)
Alkaline Phosphatase: 138 U/L — ABNORMAL HIGH (ref 38–126)
Bilirubin, Direct: 0.2 mg/dL (ref 0.0–0.2)
Indirect Bilirubin: 0.6 mg/dL (ref 0.3–0.9)
Total Bilirubin: 0.8 mg/dL (ref 0.3–1.2)
Total Protein: 6.5 g/dL (ref 6.5–8.1)

## 2019-04-06 LAB — URINE DRUG SCREEN, QUALITATIVE (ARMC ONLY)
Amphetamines, Ur Screen: POSITIVE — AB
Barbiturates, Ur Screen: NOT DETECTED
Benzodiazepine, Ur Scrn: NOT DETECTED
Cannabinoid 50 Ng, Ur ~~LOC~~: NOT DETECTED
Cocaine Metabolite,Ur ~~LOC~~: NOT DETECTED
MDMA (Ecstasy)Ur Screen: NOT DETECTED
Methadone Scn, Ur: NOT DETECTED
Opiate, Ur Screen: NOT DETECTED
Phencyclidine (PCP) Ur S: NOT DETECTED
Tricyclic, Ur Screen: NOT DETECTED

## 2019-04-06 LAB — BASIC METABOLIC PANEL
Anion gap: 10 (ref 5–15)
BUN: 7 mg/dL (ref 6–20)
CO2: 19 mmol/L — ABNORMAL LOW (ref 22–32)
Calcium: 8.2 mg/dL — ABNORMAL LOW (ref 8.9–10.3)
Chloride: 107 mmol/L (ref 98–111)
Creatinine, Ser: 0.37 mg/dL — ABNORMAL LOW (ref 0.44–1.00)
GFR calc Af Amer: >60 mL/min (ref >60)
GFR calc non Af Amer: >60 mL/min (ref >60)
Glucose, Bld: 111 mg/dL — ABNORMAL HIGH (ref 70–99)
Potassium: 3.3 mmol/L — ABNORMAL LOW (ref 3.5–5.1)
Sodium: 136 mmol/L (ref 135–145)

## 2019-04-06 LAB — CBC
HCT: 36.4 % (ref 36.0–46.0)
Hemoglobin: 11.9 g/dL — ABNORMAL LOW (ref 12.0–15.0)
MCH: 26.7 pg (ref 26.0–34.0)
MCHC: 32.7 g/dL (ref 30.0–36.0)
MCV: 81.8 fL (ref 80.0–100.0)
Platelets: 189 10*3/uL (ref 150–400)
RBC: 4.45 MIL/uL (ref 3.87–5.11)
RDW: 13.8 % (ref 11.5–15.5)
WBC: 13 10*3/uL — ABNORMAL HIGH (ref 4.0–10.5)
nRBC: 0 % (ref 0.0–0.2)

## 2019-04-06 LAB — TSH: TSH: <0.01 u[IU]/mL — ABNORMAL LOW (ref 0.350–4.500)

## 2019-04-06 LAB — APTT: aPTT: 31 seconds (ref 24–36)

## 2019-04-06 LAB — FIBRINOGEN: Fibrinogen: 294 mg/dL (ref 210–475)

## 2019-04-06 MED ORDER — OXYTOCIN BOLUS FROM INFUSION: 500.0000 mL | Freq: Once | INTRAVENOUS | Status: DC

## 2019-04-06 MED ORDER — LACTATED RINGERS IV BOLUS
500.0000 mL | Freq: Once | INTRAVENOUS | Status: AC
  Administered 2019-04-06: 500 mL via INTRAVENOUS

## 2019-04-06 MED ORDER — SODIUM CHLORIDE 0.9% IV SOLUTION: Freq: Once | INTRAVENOUS | Status: DC

## 2019-04-06 MED ORDER — LACTATED RINGERS IV SOLN
INTRAVENOUS | Status: DC
  Administered 2019-04-06: 21:00:00 via INTRAVENOUS

## 2019-04-06 MED ORDER — METHIMAZOLE 10 MG PO TABS
20.0000 mg | ORAL_TABLET | Freq: Two times a day (BID) | ORAL | Status: DC
  Administered 2019-04-06 – 2019-04-09 (×7): 20 mg via ORAL
  Filled 2019-04-06 (×7): qty 2

## 2019-04-06 MED ORDER — LACTATED RINGERS IV SOLN: 500.0000 mL | INTRAVENOUS | Status: DC | PRN

## 2019-04-06 MED ORDER — LIDOCAINE HCL (PF) 1 % IJ SOLN
30.0000 mL | INTRAMUSCULAR | Status: DC | PRN
  Filled 2019-04-06: qty 30

## 2019-04-06 MED ORDER — METOPROLOL TARTRATE 50 MG PO TABS
50.0000 mg | ORAL_TABLET | Freq: Every day | ORAL | Status: DC
  Administered 2019-04-06: 50 mg via ORAL
  Filled 2019-04-06 (×3): qty 1

## 2019-04-06 MED ORDER — OXYTOCIN 40 UNITS IN NORMAL SALINE INFUSION - SIMPLE MED
2.5000 [IU]/h | INTRAVENOUS | Status: DC
  Filled 2019-04-06 (×2): qty 1000

## 2019-04-06 NOTE — OB Triage Note (Signed)
Patient arrived in Obs 1 from ED at 1922. Complains of feeling "pressure" and had to stand up then felt bleeding running down her leg about 1 hour ago. Unsure if she is feeling contractions or fetal movement. EFM applied, pulse ox applied, fetal heart rate running close to maternal heart rate. Vanessa Rivera at bedside to perform u/s and sterile speculum exam. IV initiated as ordered and documented. Fluid bolus initiated.

## 2019-04-06 NOTE — H&P (Addendum)
OB History & Physical   History of Present Illness:  Chief Complaint:   HPI:  Vanessa Rivera is a 33 y.o. G68P1102 female with EDC=05/05/2019 at [redacted]w[redacted]d dated by LMP=8wk2d ultrasound.  Her pregnancy has been complicated by hyperthyroidism, methamphetamine and amphetamine use, mood disorder (on Lithium) and mild polyhydramnios.  She presented to L&D via EMS with vaginal bleeding. She reports experiencing a sharp pain and pressure in her lower abdomen followed by a gush of blood saturating her underwear and pants around 7PM. Upon presentation to L&D she was still leaking watery bloody fluid from the vagina, having frequent contractions, and both mother and baby were tachycardic. She denies headache, chest pain, SOB, palpitations. +FM   Prenatal care site: Prenatal care at [facility]. Previous ob history remarkable for a vaginal delivery of a 9#15 oz baby with shoulder dystocia and a preterm delivery at 35 weeks. She was started on 17P injections this pregnancy but stopped them months ago per patient. She was admitted to [facility] 03/23/2019 for amphetamine detox and was tachycardic at that time and was placed on metoprolol. Her TSH was suppressed at <0.015 and her free T4 was 3.19  She has a h/o opioid, cocaine and alcohol abuse but she denies use with this pregnancy. She is smoking 1 PPD.  TDAP 03/05/2019    Maternal Medical History:   Past Medical History:  Diagnosis Date  . Amphetamine use disorder, moderate (HCC)   . Anxiety and depression   . Goiter   . Hyperthyroidism   . Thyroid disease   . Thyroiditis   . Tobacco use affecting pregnancy in third trimester, antepartum     No past surgical history on file.  Allergies  Allergen Reactions  . Penicillins Hives    Has patient had a PCN reaction causing immediate rash, facial/tongue/throat swelling, SOB or lightheadedness with hypotension: yes  Has patient had a PCN reaction causing severe rash involving mucus membranes or skin necrosis: no Has  patient had a PCN reaction that required hospitalization: yes Has patient had a PCN reaction occurring within the last 10 years: no If all of the above answers are "NO", then may proceed with Cephalosporin use.    Prior to Admission medications   Medication Sig Start Date End Date Taking? Authorizing Provider  lithium 300 MG tablet Take 300 mg by mouth at bedtime. 11/08/18  Yes [provider]  methimazole (TAPAZOLE) 10 MG tablet Take 40 mg by mouth daily.   Yes [provider]  Prenatal Vit-Fe Fumarate-FA (PNV PRENATAL PLUS MULTIVITAMIN) 27-1 MG TABS Take 1 tablet by mouth daily.   Yes [provider]  ARIPiprazole (ABILIFY) 5 MG tablet Take 1-2 tablets by mouth daily. Take 1-2 tablets by mouth once daily for 2-3 evenings, then take 1 tablet by mouth once daily in the evening with food 11/08/18   [provider]  HYDROcodone-acetaminophen (NORCO/VICODIN) 5-325 MG tablet Take 1 tablet by mouth every 4 (four) hours as needed. Patient not taking: Reported on 11/25/2018 07/24/18   Minna Antis, MD  hydroxyprogesterone caproate (MAKENA) 250 mg/mL OIL injection Inject 1 mL into the muscle once a week. 09/25/18 03/31/19  [provider]   Metaprolol 50 mgm daily       Social History: She  reports that she has been smoking cigarettes. She has a 7.00 pack-year smoking history. She has never used smokeless tobacco. She reports previous alcohol use. She reports current drug use. Drug: Methamphetamines.  Family History: family history includes Prostate cancer in  her maternal grandfather; Thyroid disease in her maternal aunt and mother.   Review of Systems: Negative x 10 systems reviewed except as noted in the HPI.      Physical Exam:  Vital Signs: BP (!) 136/85 (BP Location: Left Arm)   Pulse (!) 179   Temp 98.5 F (36.9 C) (Oral)   Resp (!) 24   Ht 5\' 3"  (1.6 m)   Wt 68 kg   SpO2 100%   BMI 26.57 kg/m  General: gravid WF in no acute  distress, breathing thru some contractions  HEENT: normocephalic, atraumatic, pink conjunctiva Heart: tachycardic.  No murmurs. Lungs: clear to auscultation bilaterally Abdomen: frequent contractions palpated, tender with contraction, gravid,   EFW: 3000 gm Pelvic:   External: Normal external female genitalia. Blood on chux beneath her  Vagina: pooling of watery bloody fluid in posterior vagina  Cervix: Dilation: 2.5 / Effacement (%): 80 / Station: -1     ROM: - pooling; - ferning Extremities: non-tender, symmetric, no edema bilaterally.  DTRs: +1  Neurologic: Alert & oriented x 3.    Pertinent Results:  Prenatal Labs: Blood type/Rh O positive  Antibody screen negative  Rubella Varicella Immune Immune  RPR nonreactive  HBsAg nonreactive  HIV nonreactive  GC negative  Chlamydia negative  Genetic screening Quad screen negative, CF and SMA negative  1 hour GTT 108  3 hour GTT NA  GBS negative on 03/24/2019  Pap NIL with +HPV on 09/25/2018  Baseline FHR: 175 baseline with accelerations to 190, moderate variability Toco: contractions every 1-2 minutes apart  Bedside Ultrasound:  Cephalic presentation (LOA)  Posterior placenta: no evidence of abruption  Assessment:  Vanessa Rivera is a 33 y.o. 613P1102 female at 1836w6d with probable SROM, possible placenta abruption Fetal tachycardia Maternal tachycardia Hx of amphetamine use/abuse Hyperthyroidism   Plan:  1. Admit to Labor & Delivery - notified Dr Tiburcio PeaHarris of patient status. He is here at bedside and plan of management discussed. Will monitor progress and fetal status closely. Metaprolol for maternal tachycardia (has not taken recently).   2. CBC, abruption labs, T&C 2 units, UDS, TSH done stat 3. GBS negative.   4. Consents obtained. 5. O POS/ RI/ VI  Farrel ConnersColleen Alwyn Cordner  04/06/2019 9:03 PM

## 2019-04-07 ENCOUNTER — Encounter: Payer: Self-pay | Admitting: Certified Nurse Midwife

## 2019-04-07 DIAGNOSIS — R000 Tachycardia, unspecified: Secondary | ICD-10-CM | POA: Diagnosis present

## 2019-04-07 DIAGNOSIS — O4590 Premature separation of placenta, unspecified, unspecified trimester: Secondary | ICD-10-CM | POA: Diagnosis present

## 2019-04-07 DIAGNOSIS — O42919 Preterm premature rupture of membranes, unspecified as to length of time between rupture and onset of labor, unspecified trimester: Secondary | ICD-10-CM

## 2019-04-07 DIAGNOSIS — Z3A36 36 weeks gestation of pregnancy: Secondary | ICD-10-CM

## 2019-04-07 HISTORY — DX: Tachycardia, unspecified: R00.0

## 2019-04-07 HISTORY — DX: Preterm premature rupture of membranes, unspecified as to length of time between rupture and onset of labor, unspecified trimester: O42.919

## 2019-04-07 HISTORY — DX: Premature separation of placenta, unspecified, unspecified trimester: O45.90

## 2019-04-07 LAB — CBC
HCT: 30 % — ABNORMAL LOW (ref 36.0–46.0)
Hemoglobin: 10 g/dL — ABNORMAL LOW (ref 12.0–15.0)
MCH: 27 pg (ref 26.0–34.0)
MCHC: 33.3 g/dL (ref 30.0–36.0)
MCV: 80.9 fL (ref 80.0–100.0)
Platelets: 168 10*3/uL (ref 150–400)
RBC: 3.71 MIL/uL — ABNORMAL LOW (ref 3.87–5.11)
RDW: 14 % (ref 11.5–15.5)
WBC: 10.9 10*3/uL — ABNORMAL HIGH (ref 4.0–10.5)
nRBC: 0 % (ref 0.0–0.2)

## 2019-04-07 LAB — BPAM RBC
Blood Product Expiration Date: 202004212359
Blood Product Expiration Date: 202004212359
Unit Type and Rh: 5100
Unit Type and Rh: 5100

## 2019-04-07 LAB — PREPARE RBC (CROSSMATCH)

## 2019-04-07 LAB — TYPE AND SCREEN
ABO/RH(D): O POS
Antibody Screen: NEGATIVE
Unit division: 0
Unit division: 0

## 2019-04-07 LAB — KLEIHAUER-BETKE STAIN
Fetal Cells %: 0.3 %
Quantitation Fetal Hemoglobin: 0.0015 mL

## 2019-04-07 MED ORDER — PRENATAL MULTIVITAMIN CH
1.0000 | ORAL_TABLET | Freq: Every day | ORAL | Status: DC
  Administered 2019-04-07 – 2019-04-09 (×3): 1 via ORAL
  Filled 2019-04-07 (×3): qty 1

## 2019-04-07 MED ORDER — ONDANSETRON HCL 4 MG PO TABS: 4.0000 mg | ORAL_TABLET | ORAL | Status: DC | PRN

## 2019-04-07 MED ORDER — BUTORPHANOL TARTRATE 2 MG/ML IJ SOLN
1.0000 mg | Freq: Once | INTRAMUSCULAR | Status: AC
  Administered 2019-04-07: 1 mg via INTRAVENOUS

## 2019-04-07 MED ORDER — OXYTOCIN 10 UNIT/ML IJ SOLN
INTRAMUSCULAR | Status: AC
  Filled 2019-04-07: qty 2

## 2019-04-07 MED ORDER — SENNOSIDES-DOCUSATE SODIUM 8.6-50 MG PO TABS
2.0000 | ORAL_TABLET | ORAL | Status: DC
  Filled 2019-04-07: qty 2

## 2019-04-07 MED ORDER — DIBUCAINE (PERIANAL) 1 % EX OINT: 1.0000 "application " | TOPICAL_OINTMENT | CUTANEOUS | Status: DC | PRN

## 2019-04-07 MED ORDER — IBUPROFEN 600 MG PO TABS
600.0000 mg | ORAL_TABLET | Freq: Four times a day (QID) | ORAL | Status: DC
  Administered 2019-04-07 – 2019-04-09 (×12): 600 mg via ORAL
  Filled 2019-04-07 (×12): qty 1

## 2019-04-07 MED ORDER — METOPROLOL SUCCINATE ER 25 MG PO TB24
50.0000 mg | ORAL_TABLET | Freq: Every day | ORAL | Status: DC
  Administered 2019-04-07 – 2019-04-09 (×3): 50 mg via ORAL
  Filled 2019-04-07 (×3): qty 2

## 2019-04-07 MED ORDER — BENZOCAINE-MENTHOL 20-0.5 % EX AERO: 1.0000 "application " | INHALATION_SPRAY | CUTANEOUS | Status: DC | PRN

## 2019-04-07 MED ORDER — OXYCODONE HCL 5 MG PO TABS: 5.0000 mg | ORAL_TABLET | Freq: Four times a day (QID) | ORAL | Status: DC | PRN

## 2019-04-07 MED ORDER — COCONUT OIL OIL: 1.0000 "application " | TOPICAL_OIL | Status: DC | PRN

## 2019-04-07 MED ORDER — FERROUS SULFATE 325 (65 FE) MG PO TABS
325.0000 mg | ORAL_TABLET | Freq: Every day | ORAL | Status: DC
  Administered 2019-04-07 – 2019-04-09 (×3): 325 mg via ORAL
  Filled 2019-04-07 (×3): qty 1

## 2019-04-07 MED ORDER — MISOPROSTOL 200 MCG PO TABS
ORAL_TABLET | ORAL | Status: AC
  Filled 2019-04-07: qty 4

## 2019-04-07 MED ORDER — ONDANSETRON HCL 4 MG/2ML IJ SOLN: 4.0000 mg | INTRAMUSCULAR | Status: DC | PRN

## 2019-04-07 MED ORDER — SIMETHICONE 80 MG PO CHEW: 80.0000 mg | CHEWABLE_TABLET | ORAL | Status: DC | PRN

## 2019-04-07 MED ORDER — BUTORPHANOL TARTRATE 2 MG/ML IJ SOLN
INTRAMUSCULAR | Status: AC
  Administered 2019-04-07: 1 mg via INTRAVENOUS
  Filled 2019-04-07: qty 1

## 2019-04-07 MED ORDER — WITCH HAZEL-GLYCERIN EX PADS: 1.0000 "application " | MEDICATED_PAD | CUTANEOUS | Status: DC | PRN

## 2019-04-07 MED ORDER — AMMONIA AROMATIC IN INHA
RESPIRATORY_TRACT | Status: AC
  Filled 2019-04-07: qty 10

## 2019-04-07 MED ORDER — LIDOCAINE HCL (PF) 1 % IJ SOLN
INTRAMUSCULAR | Status: AC
  Filled 2019-04-07: qty 30

## 2019-04-07 MED ORDER — LITHIUM CARBONATE 300 MG PO CAPS
300.0000 mg | ORAL_CAPSULE | Freq: Every day | ORAL | Status: DC
  Administered 2019-04-07 – 2019-04-09 (×3): 300 mg via ORAL
  Filled 2019-04-07 (×3): qty 1

## 2019-04-07 MED ORDER — BUTORPHANOL TARTRATE 2 MG/ML IJ SOLN
2.0000 mg | INTRAMUSCULAR | Status: DC | PRN
  Filled 2019-04-07: qty 1

## 2019-04-07 NOTE — Progress Notes (Signed)
L&D Progress Note   S: Complaining of increased pain with contractions since AROM of forebag at 2004   O: BP 128/66 (BP Location: Left Arm)   Pulse (!) 139   Temp 98.8 F (37.1 C) (Oral)   Resp (!) 24   Ht 5\' 3"  (1.6 m)   Wt 68 kg   SpO2 100%   BMI 26.57 kg/m    General: crying with contractions  FHR: 135 to 140 with accelerations to 160s, occasional irregularity audible. Occasional Toco: contractions every 2 minutes Cervix: 3.5/80-90%/-1. ?meconium staining to fluid. Decreased bleeding  Results for orders placed or performed during the hospital encounter of 04/06/19 (from the past 24 hour(s))  CBC     Status: Abnormal   Collection Time: 04/06/19  7:57 PM  Result Value Ref Range   WBC 13.0 (H) 4.0 - 10.5 K/uL   RBC 4.45 3.87 - 5.11 MIL/uL   Hemoglobin 11.9 (L) 12.0 - 15.0 g/dL   HCT 20.3 55.9 - 74.1 %   MCV 81.8 80.0 - 100.0 fL   MCH 26.7 26.0 - 34.0 pg   MCHC 32.7 30.0 - 36.0 g/dL   RDW 63.8 45.3 - 64.6 %   Platelets 189 150 - 400 K/uL   nRBC 0.0 0.0 - 0.2 %  Basic metabolic panel     Status: Abnormal   Collection Time: 04/06/19  7:57 PM  Result Value Ref Range   Sodium 136 135 - 145 mmol/L   Potassium 3.3 (L) 3.5 - 5.1 mmol/L   Chloride 107 98 - 111 mmol/L   CO2 19 (L) 22 - 32 mmol/L   Glucose, Bld 111 (H) 70 - 99 mg/dL   BUN 7 6 - 20 mg/dL   Creatinine, Ser 8.03 (L) 0.44 - 1.00 mg/dL   Calcium 8.2 (L) 8.9 - 10.3 mg/dL   GFR calc non Af Amer >60 >60 mL/min   GFR calc Af Amer >60 >60 mL/min   Anion gap 10 5 - 15  APTT     Status: None   Collection Time: 04/06/19  7:57 PM  Result Value Ref Range   aPTT 31 24 - 36 seconds  Protime-INR     Status: None   Collection Time: 04/06/19  7:57 PM  Result Value Ref Range   Prothrombin Time 13.7 11.4 - 15.2 seconds   INR 1.1 0.8 - 1.2  Fibrinogen     Status: None   Collection Time: 04/06/19  7:57 PM  Result Value Ref Range   Fibrinogen 294 210 - 475 mg/dL  Kleihauer-Betke stain     Status: None   Collection Time:  04/06/19  7:57 PM  Result Value Ref Range   Fetal Cells % 0.7 %   Quantitation Fetal Hemoglobin 0.003 mL   # Vials RhIg NOT INDICATED   TSH     Status: Abnormal   Collection Time: 04/06/19  7:57 PM  Result Value Ref Range   TSH <0.010 (L) 0.350 - 4.500 uIU/mL  Hepatic function panel     Status: Abnormal   Collection Time: 04/06/19  7:57 PM  Result Value Ref Range   Total Protein 6.5 6.5 - 8.1 g/dL   Albumin 3.0 (L) 3.5 - 5.0 g/dL   AST 21 15 - 41 U/L   ALT 17 0 - 44 U/L   Alkaline Phosphatase 138 (H) 38 - 126 U/L   Total Bilirubin 0.8 0.3 - 1.2 mg/dL   Bilirubin, Direct 0.2 0.0 - 0.2 mg/dL   Indirect Bilirubin  0.6 0.3 - 0.9 mg/dL  Type and screen     Status: None (Preliminary result)   Collection Time: 04/06/19  7:58 PM  Result Value Ref Range   ABO/RH(D) O POS    Antibody Screen NEG    Sample Expiration      04/09/2019 Performed at [facility], [address] [place], [place] [address]    Unit Number [id number]    Blood Component Type RED CELLS,LR    Unit division 00    Status of Unit ALLOCATED    Transfusion Status OK TO TRANSFUSE    Crossmatch Result Compatible    Unit Number [id number]    Blood Component Type RED CELLS,LR    Unit division 00    Status of Unit ALLOCATED    Transfusion Status OK TO TRANSFUSE    Crossmatch Result Compatible   ABO/Rh     Status: None   Collection Time: 04/06/19  8:03 PM  Result Value Ref Range   ABO/RH(D)      O POS Performed at [facility], [address] [place], [place] [address]   Prepare RBC     Status: None   Collection Time: 04/06/19  8:36 PM  Result Value Ref Range   Order Confirmation      ORDER PROCESSED BY BLOOD BANK Performed at [facility], [address] [place], [place] [address]   Urine Drug Screen, Qualitative ([facility] only)     Status: Abnormal   Collection Time: 04/06/19  8:36 PM  Result Value Ref Range   Tricyclic, Ur Screen NONE DETECTED NONE DETECTED    Amphetamines, Ur Screen POSITIVE (A) NONE DETECTED   MDMA (Ecstasy)Ur Screen NONE DETECTED NONE DETECTED   Cocaine Metabolite,Ur Brilliant NONE DETECTED NONE DETECTED   Opiate, Ur Screen NONE DETECTED NONE DETECTED   Phencyclidine (PCP) Ur S NONE DETECTED NONE DETECTED   Cannabinoid 50 Ng, Ur Hominy NONE DETECTED NONE DETECTED   Barbiturates, Ur Screen NONE DETECTED NONE DETECTED   Benzodiazepine, Ur Scrn NONE DETECTED NONE DETECTED   Methadone Scn, Ur NONE DETECTED NONE DETECTED  Protein / creatinine ratio, urine     Status: Abnormal   Collection Time: 04/06/19  8:36 PM  Result Value Ref Range   Creatinine, Urine 121 mg/dL   Total Protein, Urine 129 mg/dL   Protein Creatinine Ratio 1.07 (H) 0.00 - 0.15 mg/mg[Cre]    A: IUP at 36 weeks with placental abruption s/p SROM Maternal tachycardia probably due to Amphetamine abuse and hyperthyroidism-patient's pulse responded nicely to metoprolol. Pulse now 100s to 110s FWB: Fetal tachycardia resolved after maternal pulse decreased-some intermittent fetal heart rate irregularity but Cat 1 currently. Possible MSAF Elevated protein and initial blood pressure in mild range. Blood pressures normal after metoprolol given ?preeclampsia  P: IUPC inserted-contractions are adequate. Will monitor progress Stadol for pain Discussed possibility of epidural with anesthesia; patient will not be able to get epidural due to amphetamine use.  Farrel Connersolleen Lasean Rahming, CNM

## 2019-04-07 NOTE — Progress Notes (Signed)
Post Partum Day 0, 7 hours postpartum  Subjective: Doing well, no complaints.  Tolerating regular diet, pain with PO meds, voiding and ambulating without difficulty. Discussed her follow up care. She will follow up with [facility] postpartum psychiatry and OB. She is undecided about which method of birth control but is leaning towards LARC. She has to discuss with her Partner.   No CP SOB F/C N/V or leg pain No HA, change of vision, RUQ/epigastric pain  Objective: BP 113/68 (BP Location: Left Arm)   Pulse (!) 117   Temp 98.7 F (37.1 C) (Oral)   Resp 18   Ht 5\' 3"  (1.6 m)   Wt 68 kg   SpO2 98%  BMI 26.57 kg/m    Physical Exam:  General: NAD CV: RRR Pulm: nl effort, CTABL Lochia: moderate Uterine Fundus: fundus firm and below umbilicus DVT Evaluation: no cords, ttp LEs   Recent Labs    04/06/19 1957 04/07/19 0724  HGB 11.9* 10.0*  HCT 36.4 30.0*  WBC 13.0* 10.9*  PLT 189 168    Assessment/Plan: 33 y.o. [id number] postpartum day # 0, 7 hours postpartum  1. Continue routine postpartum care 2. O positive, Rubella Immune, Varicella Immune 3. TDAP given antepartum 4. Formula feeding 5. Contraception: undecided 6. Disposition: possible DC tomorrow or possible extend to Day 2 due to baby staying for detox observation   Parke Poisson, CNM [facility] 04/07/2019, 10:47 AM

## 2019-04-07 NOTE — Discharge Summary (Signed)
Physician Obstetric Discharge Summary  Patient ID: Vanessa Rivera MRN: [id number] DOB/AGE: 33/06/1986 33 y.o.   Date of Admission: 04/06/2019 Date of Delivery: 04/07/2019 Date of Discharge: 04/09/2019  Admitting Diagnosis: Premature preterm rupture of membranes, Placental abruption at [id number]  Secondary Diagnosis: amphetamine abuse, hyperthyroidism, fetal tachycardia, maternal tachycardia  Mode of Delivery: normal spontaneous vaginal delivery 04/07/2019      Discharge Diagnosis: Preterm labor and delivery at 36 weeks   Intrapartum Procedures: placement of intrauterine catheter and AROM of forebag   Post partum procedures: None  Complications: none   Brief Hospital Course  Vanessa Rivera is a [id number] who had a SVD on 04/07/2019;  for further details of this delivery, please refer to the delivery note.  Patient had a postpartum course complicated by.  By time of discharge on PPD#2, her pain was controlled on oral pain medications; she had appropriate lochia and was ambulating, voiding without difficulty and tolerating regular diet.  She was deemed stable for discharge to home.    Labs: CBC Latest Ref Rng & Units 04/07/2019 04/06/2019 02/11/2019  WBC 4.0 - 10.5 K/uL 10.9(H) 13.0(H) 4.1  Hemoglobin 12.0 - 15.0 g/dL 10.0(L) 11.9(L) 10.5(L)  Hematocrit 36.0 - 46.0 % 30.0(L) 36.4 31.3(L)  Platelets 150 - 400 K/uL 168 189 135(L)   O POS Performed at [facility], [address] [place], [place] [address]   Physical exam:  Blood pressure 129/79, pulse (!) 104, temperature 97.9 F (36.6 C), temperature source Oral, resp. rate 18, height 5\' 3"  (1.6 m), weight 68 kg, SpO2 100 %, unknown if currently breastfeeding. General: alert and no distress Lochia: appropriate Abdomen: soft, NT Uterine Fundus: firm Extremities: No evidence of DVT seen on physical exam. No lower extremity edema.  Discharge Instructions: Per After Visit Summary. Activity: Advance as tolerated. Pelvic rest  for 6 weeks.  Also refer to Discharge Instructions Diet: Regular  Medications: Allergies as of 04/09/2019      Reactions   Penicillins Hives   Has patient had a PCN reaction causing immediate rash, facial/tongue/throat swelling, SOB or lightheadedness with hypotension: yes  Has patient had a PCN reaction causing severe rash involving mucus membranes or skin necrosis: no Has patient had a PCN reaction that required hospitalization: yes Has patient had a PCN reaction occurring within the last 10 years: no If all of the above answers are "NO", then may proceed with Cephalosporin use.      Medication List    STOP taking these medications   HYDROcodone-acetaminophen 5-325 MG tablet Commonly known as:  NORCO/VICODIN     TAKE these medications   lithium 300 MG tablet Take 300 mg by mouth at bedtime.   methimazole 10 MG tablet Commonly known as:  TAPAZOLE Take 20 mg by mouth 2 (two) times daily.   metoprolol succinate 50 MG 24 hr tablet Commonly known as:  TOPROL-XL Take 50 mg by mouth daily. Take with or immediately following a meal.   PNV Prenatal Plus Multivitamin 27-1 MG Tabs Take 1 tablet by mouth daily.      Outpatient follow up:  Follow-up Information    [facility]. Schedule an appointment as soon as possible for a visit in 4 week(s).   Why:  Postpartum care/IUD if desired           Postpartum contraception: Considering IUD or BTL  Discharged Condition: good  Discharged to: home  Newborn Data: 7#3oz  Disposition: Inpatient for phototherapy, mother will board in room  Apgars: APGAR (1 MIN): 8   APGAR (5 MINS): 9    Baby Feeding: Formula  Oswaldo Conroy, CNM 04/09/2019

## 2019-04-08 LAB — SURGICAL PATHOLOGY

## 2019-04-08 NOTE — Progress Notes (Signed)
Hallway outside patient's room smelled like smoke. Asked patient if she had been smoking in room- pt denied but stated "maybe my boyfriend did, let me ask him" then picked up the phone to send a text message. Educated pt no one can smoke in hospital- pt verbalized understanding.

## 2019-04-08 NOTE — Discharge Instructions (Signed)

## 2019-04-08 NOTE — Clinical Social Work Maternal (Signed)
CLINICAL SOCIAL WORK MATERNAL/CHILD NOTE  Patient Details  Name: Vanessa Rivera MRN: [id number] Date of Birth: 29-Mar-1986  Date:  04/08/2019  Clinical Social Worker Initiating Note:  Annamaria Boots  Date/Time: Initiated:  04/08/19/1522     Child's Name:  Vanessa Rivera    Biological Parents:  Mother, Father   Need for Interpreter:  None   Reason for Referral:  Current Substance Use/Substance Use During Pregnancy    Address:  [place] [address]    Phone number:  [phone] (home)     Additional phone number:   Household Members/Support Persons (HM/SP):       HM/SP Name Relationship DOB or Age  HM/SP -1        HM/SP -2        HM/SP -3        HM/SP -4        HM/SP -5        HM/SP -6        HM/SP -7        HM/SP -8          Natural Supports (not living in the home):  Extended Family   Professional Supports: Case Metallurgist   Employment: Unemployed   Type of Work:     Education:  Programmer, systems   Homebound arranged:    Museum/gallery curator Resources:  Medicaid   Other Resources:  [facility]   Cultural/Religious Considerations Which May Impact Care:    Strengths:  Ability to meet basic needs , Understanding of illness, Pediatrician chosen   Psychotropic Medications:         Pediatrician:    [employer]  Pediatrician List:   [place]      Pediatrician Fax Number:    Risk Factors/Current Problems:  Substance Use    Cognitive State:  Alert , Able to Concentrate    Mood/Affect:  Calm , Comfortable    CSW Assessment: CSW consulted for mother having positive drug screen. CSW met with patient to discuss. Patient reports that she lives with her boyfriend (father of baby). Patient reports that she also has 2 other children ages 40 and 1 that are living with their father right now. Per patient, she shares  custody with her previous boyfriend/father of the other children. Patient reports that she has all necessary items for baby at home and is currently on [facility] and Medicaid. Patient states that she also has family support. CSW asked patient about drug use and patient admits to using Meth recreationally. Per patient, she has not used in a while and does not remember the last time she used. Patient reports that she is currently in "drug classes" which include home visits and she is working with [facility] for outpatient drug treatment as well. CSW explained that she and baby have tested positive for amphetamines. Due to this, CSW will have to make a CPS report. Patient states that she is worried about losing her baby and does not want to lose custody. CSW explained that it would be up to CPS as to what the plan is for baby. Patient has never dealt with CPS before and is concerned. Per RN patient and baby have been doing well. RN states that baby shows no signs of withdrawal at this time. CSW made CPS report. CPS will evaluate and follow up.  CSW Plan/Description:  Child Protective Service Report     Sissy Hoff 04/08/2019, 3:26 PM

## 2019-04-08 NOTE — Lactation Note (Signed)
This note was copied from a baby's chart. Lactation Consultation Note  Patient Name: Girl Cletis Media [id number] Date: 04/08/2019     Maternal Data    Feeding Feeding Type: Bottle Fed - Formula Nipple Type: Slow - flow  LATCH Score                   Interventions    Lactation Tools Discussed/Used     Consult Status      Burnadette Peter 04/08/2019, 10:30 AM

## 2019-04-08 NOTE — Progress Notes (Signed)
Admit Date: 04/06/2019 Today's Date: 04/08/2019  Post Partum Day 1  Subjective:  no complaints, up ad lib, voiding, tolerating PO, + flatus and + bowel movement  Objective: Temp:  [97.7 F (36.5 C)-98.4 F (36.9 C)] 98 F (36.7 C) (04/21 0816) Pulse Rate:  [75-120] 91 (04/21 0921) Resp:  [18-20] 20 (04/21 0816) BP: (102-111)/(60-77) 107/60 (04/21 0921) SpO2:  [97 %-100 %] 100 % (04/21 0816)  Physical Exam:  General: alert and cooperative Lochia: appropriate Uterine Fundus: firm Incision: none DVT Evaluation: No evidence of DVT seen on physical exam.  Recent Labs    04/06/19 1957 04/07/19 0724  HGB 11.9* 10.0*  HCT 36.4 30.0*    Assessment/Plan: 32 y.o. [id number] postpartum day # 1  1. Continue routine postpartum care 2. O positive, Rubella Immune, Varicella Immune 3. TDAP given antepartum 4. Formula feeding 5. Contraception: undecided 6. Disposition: possible DC tomorrow, baby rooming in and being monitored for symptoms of withdrawal    LOS: 2 days   [facility] Ma[facility]s [facility] 04/08/2019, 9:50 AM

## 2019-04-09 NOTE — Progress Notes (Signed)
Received report on pt; all pt discharge teaching has been completed by previous nurse; pt has meds scheduled at 2200; pt to stay a "patient" in the "system" until the 3 meds at 2200 are given to pt; then pt will be discharged BUT will stay with her baby; baby staying due to jaundice/phototherapy

## 2019-04-09 NOTE — Progress Notes (Signed)
Discharge instructions, prescriptions and follow up appointment given to and reviewed with patient. Patient verbalized understanding. Patient will remain a "patient" until she gets her medications tonight. Then patient will be discharged but will room in with infant.

## 2019-04-09 NOTE — Progress Notes (Signed)
Spoke with Candace, CSW, she stated it was ok for mom to go home with infant. CPS would follow up once the infant is discharged.

## 2019-04-09 NOTE — Progress Notes (Signed)
Pt has no questions at this time regarding discharge instructions; hopefully tomorrow baby will be ready for discharge and she will then take baby home; pt staying in room 345 with baby tonight

## 2019-06-16 ENCOUNTER — Other Ambulatory Visit: Payer: Self-pay

## 2019-06-16 ENCOUNTER — Ambulatory Visit (LOCAL_COMMUNITY_HEALTH_CENTER): Payer: Medicaid Other | Admitting: Nurse Practitioner

## 2019-06-16 VITALS — BP 106/70 | Ht 63.0 in | Wt 127.2 lb

## 2019-06-16 DIAGNOSIS — Z3009 Encounter for other general counseling and advice on contraception: Secondary | ICD-10-CM

## 2019-06-16 MED ORDER — MULTI-VITAMIN/MINERALS PO TABS: 1.0000 | ORAL_TABLET | Freq: Every day | ORAL | 3 refills | Status: AC

## 2019-06-16 MED ORDER — MULTI-VITAMIN/MINERALS PO TABS: 1.0000 | ORAL_TABLET | Freq: Every day | ORAL | 3 refills | Status: DC

## 2019-06-16 NOTE — Progress Notes (Signed)
In for [facility]; s/p SVD 04/07/19; declines HIV/RPR testing.  Micronor script written by FNP x 3 mths; instructions given.

## 2019-06-16 NOTE — Progress Notes (Signed)
   Subjective:    Patient ID: Vanessa Rivera, female    DOB: 02-02-1986, 33 y.o.   MRN: [id number]  Client into clinic request OCP's for [facility] Denies any additional significant medical history.   Other    Brief ROS completed Review of Systems  All other systems reviewed and are negative. See problem list     Objective:   Physical Exam Vitals signs and nursing note reviewed.  Constitutional:      Appearance: Normal appearance.  HENT:     Head: Normocephalic.     Mouth/Throat:     Mouth: Mucous membranes are moist.     Pharynx: Oropharynx is clear.  Eyes:     General: Lids are normal.     Extraocular Movements: Extraocular movements intact.  Neck:     Musculoskeletal: Full passive range of motion without pain, normal range of motion and neck supple.  Cardiovascular:     Rate and Rhythm: Normal rate and regular rhythm.     Pulses: Normal pulses.     Heart sounds: Normal heart sounds.  Pulmonary:     Effort: Pulmonary effort is normal.     Breath sounds: Normal breath sounds.  Genitourinary:    Comments: Declines exam at this time Musculoskeletal: Normal range of motion.  Lymphadenopathy:     Cervical: No cervical adenopathy.  Skin:    General: Skin is warm and dry.  Neurological:     Mental Status: She is alert and oriented to person, place, and time.  Psychiatric:        Attention and Perception: Attention and perception normal.        Mood and Affect: Mood normal.        Speech: Speech normal.        Behavior: Behavior is cooperative.           Assessment & Plan:    Please give RX for Micronor - take 1 tab po q daily at the same time, #3, RF#0 Advised client to RTC in 3 months to evaluate OCP's and continue at that time. Discussed with client how to properly take OCP's and what to do if pills are missed. Client verbalizes understanding and is in agreement with plan of care

## 2019-06-23 ENCOUNTER — Encounter: Payer: Self-pay | Admitting: Nurse Practitioner

## 2019-06-26 ENCOUNTER — Emergency Department: Payer: Medicaid Other

## 2019-06-26 ENCOUNTER — Emergency Department
Admission: EM | Admit: 2019-06-26 | Discharge: 2019-06-27 | Disposition: A | Discharge status: Home / Self Care | Payer: Medicaid Other | Attending: Emergency Medicine | Admitting: Emergency Medicine

## 2019-06-26 ENCOUNTER — Other Ambulatory Visit: Payer: Self-pay

## 2019-06-26 DIAGNOSIS — R300 Dysuria: Secondary | ICD-10-CM | POA: Diagnosis not present

## 2019-06-26 DIAGNOSIS — Z79899 Other long term (current) drug therapy: Secondary | ICD-10-CM | POA: Diagnosis not present

## 2019-06-26 DIAGNOSIS — F17210 Nicotine dependence, cigarettes, uncomplicated: Secondary | ICD-10-CM | POA: Insufficient documentation

## 2019-06-26 DIAGNOSIS — R509 Fever, unspecified: Secondary | ICD-10-CM | POA: Diagnosis present

## 2019-06-26 DIAGNOSIS — N200 Calculus of kidney: Secondary | ICD-10-CM

## 2019-06-26 LAB — URINALYSIS, COMPLETE (UACMP) WITH MICROSCOPIC
Bacteria, UA: NONE SEEN
Bilirubin Urine: NEGATIVE
Glucose, UA: NEGATIVE mg/dL
Hgb urine dipstick: NEGATIVE
Ketones, ur: NEGATIVE mg/dL
Leukocytes,Ua: NEGATIVE
Nitrite: NEGATIVE
Protein, ur: NEGATIVE mg/dL
Specific Gravity, Urine: 1.004 — ABNORMAL LOW (ref 1.005–1.030)
pH: 7 (ref 5.0–8.0)

## 2019-06-26 LAB — COMPREHENSIVE METABOLIC PANEL
ALT: 25 U/L (ref 0–44)
AST: 22 U/L (ref 15–41)
Albumin: 3.6 g/dL (ref 3.5–5.0)
Alkaline Phosphatase: 115 U/L (ref 38–126)
Anion gap: 11 (ref 5–15)
BUN: 9 mg/dL (ref 6–20)
CO2: 24 mmol/L (ref 22–32)
Calcium: 8.8 mg/dL — ABNORMAL LOW (ref 8.9–10.3)
Chloride: 101 mmol/L (ref 98–111)
Creatinine, Ser: 0.56 mg/dL (ref 0.44–1.00)
GFR calc Af Amer: >60 mL/min (ref >60)
GFR calc non Af Amer: >60 mL/min (ref >60)
Glucose, Bld: 115 mg/dL — ABNORMAL HIGH (ref 70–99)
Potassium: 3.5 mmol/L (ref 3.5–5.1)
Sodium: 136 mmol/L (ref 135–145)
Total Bilirubin: 0.2 mg/dL — ABNORMAL LOW (ref 0.3–1.2)
Total Protein: 7.7 g/dL (ref 6.5–8.1)

## 2019-06-26 LAB — CBC WITH DIFFERENTIAL/PLATELET
Abs Immature Granulocytes: 0.02 10*3/uL (ref 0.00–0.07)
Basophils Absolute: 0 10*3/uL (ref 0.0–0.1)
Basophils Relative: 1 %
Eosinophils Absolute: 0 10*3/uL (ref 0.0–0.5)
Eosinophils Relative: 1 %
HCT: 35.1 % — ABNORMAL LOW (ref 36.0–46.0)
Hemoglobin: 11.3 g/dL — ABNORMAL LOW (ref 12.0–15.0)
Immature Granulocytes: 0 %
Lymphocytes Relative: 30 %
Lymphs Abs: 2.4 10*3/uL (ref 0.7–4.0)
MCH: 26.4 pg (ref 26.0–34.0)
MCHC: 32.2 g/dL (ref 30.0–36.0)
MCV: 82 fL (ref 80.0–100.0)
Monocytes Absolute: 0.4 10*3/uL (ref 0.1–1.0)
Monocytes Relative: 5 %
Neutro Abs: 5.2 10*3/uL (ref 1.7–7.7)
Neutrophils Relative %: 63 %
Platelets: 395 10*3/uL (ref 150–400)
RBC: 4.28 MIL/uL (ref 3.87–5.11)
RDW: 14.6 % (ref 11.5–15.5)
WBC: 8.2 10*3/uL (ref 4.0–10.5)
nRBC: 0 % (ref 0.0–0.2)

## 2019-06-26 LAB — LITHIUM LEVEL: Lithium Lvl: <0.06 mmol/L — ABNORMAL LOW (ref 0.60–1.20)

## 2019-06-26 LAB — PREGNANCY, URINE: Preg Test, Ur: NEGATIVE

## 2019-06-26 MED ORDER — MORPHINE SULFATE (PF) 2 MG/ML IV SOLN
INTRAVENOUS | Status: AC
  Filled 2019-06-26: qty 1

## 2019-06-26 MED ORDER — MORPHINE SULFATE (PF) 2 MG/ML IV SOLN
2.0000 mg | Freq: Once | INTRAVENOUS | Status: AC
  Administered 2019-06-26: 2 mg via INTRAVENOUS

## 2019-06-26 MED ORDER — KETOROLAC TROMETHAMINE 10 MG PO TABS: 10.0000 mg | ORAL_TABLET | Freq: Four times a day (QID) | ORAL | 0 refills | Status: DC | PRN

## 2019-06-26 MED ORDER — IOHEXOL 300 MG/ML  SOLN
100.0000 mL | Freq: Once | INTRAMUSCULAR | Status: AC | PRN
  Administered 2019-06-26: 22:00:00 100 mL via INTRAVENOUS
  Filled 2019-06-26: qty 100

## 2019-06-26 NOTE — ED Triage Notes (Signed)
Pt to the er for fever and chills. Pt reports pressure over the bladder. Pt currently treated with antibiotics for UTI. Pt has one pill left. Pt is treating fever at home with tylenol and ibuprofen. Pt is tachycardic in triage.

## 2019-06-26 NOTE — ED Notes (Signed)
Patient transported to CT 

## 2019-06-26 NOTE — ED Provider Notes (Signed)
[facility] Emergency Department Provider Note   First MD Initiated Contact with Patient 06/26/19 2008     (approximate)  I have reviewed the triage vital signs and the nursing notes.   HISTORY  Chief Complaint Fever   HPI Vanessa Rivera is a 33 y.o. female with below list of previous medical conditions presents to the emergency department with fever and chills x10 days.  Patient states that she also has urinary frequency and dysuria for which she is currently being treated for urinary tract infection.  Patient states that she was prescribed ciprofloxacin twice daily for 10 days which she has been compliant with however symptoms has persisted.  Patient also admits to back pain worse on the right with current pain score 6 out of 10.  Patient denies any nausea or vomiting.  Patient denies any diarrhea constipation or cough.        Past Medical History:  Diagnosis Date   Amphetamine use disorder, moderate (HCC)    Anxiety and depression    Goiter    Hyperthyroidism    Thyroid disease    Thyroiditis    Tobacco use affecting pregnancy in third trimester, antepartum     Patient Active Problem List   Diagnosis Date Noted   Placental abruption affecting delivery 04/07/2019   Preterm premature rupture of membranes (PPROM) delivered, current hospitalization 04/07/2019   Fetal tachycardia affecting care of mother, delivered 04/07/2019   Tachycardia with greater than 160 beats per minute 04/07/2019   Normal vaginal delivery 04/07/2019   Postpartum care following vaginal delivery 04/07/2019   Vaginal bleeding during pregnancy 04/06/2019   Goiter 10/08/2017   Hyperthyroidism 10/08/2017   Lymphadenopathy 10/08/2017   Migraine with aura 01/05/2005    History reviewed. No pertinent surgical history.  Prior to Admission medications   Medication Sig Start Date End Date Taking? Authorizing Provider  ketorolac (TORADOL) 10 MG tablet Take 1  tablet (10 mg total) by mouth every 6 (six) hours as needed. 06/26/19   Darci CurrentBrown, Elmore N, MD  lithium 300 MG tablet Take 300 mg by mouth at bedtime. 11/08/18   [provider]  methimazole (TAPAZOLE) 10 MG tablet Take 20 mg by mouth 2 (two) times daily.    [provider]  metoprolol succinate (TOPROL-XL) 50 MG 24 hr tablet Take 50 mg by mouth daily. Take with or immediately following a meal.    [provider]  Multiple Vitamins-Minerals (MULTIVITAMIN WITH MINERALS) tablet Take 1 tablet by mouth daily. 06/16/19 09/16/19  Tessa LernerLeath, Karla Wright, NP  Norgestimate-Ethinyl Estradiol Triphasic (ORTHO TRI-CYCLEN, 28,) 0.18/0.215/0.25 MG-35 MCG tablet Take 1 tablet by mouth daily. 06/19/18 06/20/19  Larene PickettLatta, Cynthia, FNP  Prenatal Vit-Fe Fumarate-FA (PNV PRENATAL PLUS MULTIVITAMIN) 27-1 MG TABS Take 1 tablet by mouth daily.    [provider]    Allergies Penicillins  Family History  Problem Relation Age of Onset   Thyroid disease Mother    Bipolar disorder Mother    Thyroid disease Maternal Aunt    Prostate cancer Maternal Grandfather     Social History Social History   Tobacco Use   Smoking status: Current Every Day Smoker    Packs/day: 1.00    Years: 7.00    Pack years: 7.00    Types: Cigarettes   Smokeless tobacco: Never Used  Substance Use Topics   Alcohol use: Not Currently   Drug use: Not Currently    Types: Methamphetamines    Comment: past opioid, cocaine and alcohol use;  last used meth around lunch today 04/06/19    Review of Systems Constitutional: No fever/chills Eyes: No visual changes. ENT: No sore throat. Cardiovascular: Denies chest pain. Respiratory: Denies shortness of breath. Gastrointestinal: No abdominal pain.  No nausea, no vomiting.  No diarrhea.  No constipation. Genitourinary: Positive for dysuria frequency and pelvic pressure. Musculoskeletal: Negative for neck pain.  Negative for back pain. Integumentary: Negative for  rash. Neurological: Negative for headaches, focal weakness or numbness.  ____________________________________________   PHYSICAL EXAM:  VITAL SIGNS: ED Triage Vitals  Enc Vitals Group     BP 06/26/19 1940 115/76     Pulse Rate 06/26/19 1940 (!) 125     Resp 06/26/19 1940 18     Temp 06/26/19 1940 99.6 F (37.6 C)     Temp Source 06/26/19 1940 Oral     SpO2 06/26/19 1940 98 %     Weight 06/26/19 1941 57.6 kg (127 lb)     Height 06/26/19 1941 1.6 m (5\' 3" )     Head Circumference --      Peak Flow --      Pain Score 06/26/19 1941 5     Pain Loc --      Pain Edu? --      Excl. in GC? --     Constitutional: Alert and oriented. Well appearing and in no acute distress. Eyes: Conjunctivae are normal.  Mouth/Throat: Mucous membranes are moist.  Oropharynx non-erythematous. Neck: No stridor.   Cardiovascular: Normal rate, regular rhythm. Good peripheral circulation. Grossly normal heart sounds. Respiratory: Normal respiratory effort.  No retractions. No audible wheezing. Gastrointestinal: Soft and nontender. No distention.  Musculoskeletal: No lower extremity tenderness nor edema. No gross deformities of extremities. Neurologic:  Normal speech and language. No gross focal neurologic deficits are appreciated.  Skin:  Skin is warm, dry and intact. No rash noted. Psychiatric: Mood and affect are normal. Speech and behavior are normal.  ____________________________________________   LABS (all labs ordered are listed, but only abnormal results are displayed)  Labs Reviewed  CBC WITH DIFFERENTIAL/PLATELET - Abnormal; Notable for the following components:      Result Value   Hemoglobin 11.3 (*)    HCT 35.1 (*)    All other components within normal limits  COMPREHENSIVE METABOLIC PANEL - Abnormal; Notable for the following components:   Glucose, Bld 115 (*)    Calcium 8.8 (*)    Total Bilirubin 0.2 (*)    All other components within normal limits  URINALYSIS, COMPLETE (UACMP)  WITH MICROSCOPIC - Abnormal; Notable for the following components:   Color, Urine STRAW (*)    APPearance CLEAR (*)    Specific Gravity, Urine 1.004 (*)    All other components within normal limits  LITHIUM LEVEL - Abnormal; Notable for the following components:   Lithium Lvl <0.06 (*)    All other components within normal limits  URINE CULTURE  CULTURE, BLOOD (ROUTINE X 2)  CULTURE, BLOOD (ROUTINE X 2)  PREGNANCY, URINE   ____________  RADIOLOGY I, Coleridge N Analeigha Nauman, personally viewed and evaluated these images (plain radiographs) as part of my medical decision making, as well as reviewing the written report by the radiologist.  ED MD interpretation: Millimeter stone noted in the dependent portion of the patient's bladder.  Official radiology report(s): Ct Abdomen Pelvis W Contrast  Result Date: 06/26/2019 CLINICAL DATA:  Fever and chills with pelvic pressure EXAM: CT ABDOMEN AND PELVIS WITH CONTRAST TECHNIQUE: Multidetector CT imaging of the abdomen and pelvis  was performed using the standard protocol following bolus administration of intravenous contrast. CONTRAST:  100mL OMNIPAQUE IOHEXOL 300 MG/ML  SOLN COMPARISON:  None. FINDINGS: Lower chest: No acute abnormality. Hepatobiliary: No focal liver abnormality is seen. No gallstones, gallbladder wall thickening, or biliary dilatation. Pancreas: Unremarkable. No pancreatic ductal dilatation or surrounding inflammatory changes. Spleen: Normal in size without focal abnormality. Adrenals/Urinary Tract: Adrenal glands are within normal limits. Renal cyst is noted in the upper pole of the right kidney. Tiny renal calculus is noted in the lower pole of the left kidney. No obstructive changes are seen. Bladder is well distended. A small 4 mm stone is noted within the dependent portion of the bladder of uncertain chronicity. This may be related to the recent symptomatology although no hydronephrosis is seen. Tiny focus of air is noted within the  nondependent portion of the bladder. Correlation with any recent intervention is recommended. Stomach/Bowel: No obstructive or inflammatory changes of large or small bowel are seen. The appendix is within normal limits. Small bowel is unremarkable. No gastric abnormality is seen. Vascular/Lymphatic: No significant vascular findings are present. No enlarged abdominal or pelvic lymph nodes. Reproductive: Uterus and bilateral adnexa are unremarkable. Other: No abdominal wall hernia or abnormality. No abdominopelvic ascites. Musculoskeletal: No acute or significant osseous findings. IMPRESSION: 4 mm stone in the dependent portion of the bladder likely related to recently passed stone. This is of uncertain chronicity. Small focus of air is noted within the bladder. This may be related to recent instrumentation. Tiny nonobstructing left renal stone. Electronically Signed   By: Alcide CleverMark  Lukens M.D.   On: 06/26/2019 23:25     Procedures   ____________________________________________   INITIAL IMPRESSION / MDM / ASSESSMENT AND PLAN / ED COURSE  As part of my medical decision making, I reviewed the following data within the electronic MEDICAL RECORD NUMBER  33 year old female present with above-stated history and physical exam started for possible UTI/pyelonephritis or possibly kidney stones.  Patient's urinalysis revealed no evidence of UTI.  CT scan performed revealed 4 mm stone in the dependent portion of the bladder consistent with recently passed kidney stone.      ____________________________________________  FINAL CLINICAL IMPRESSION(S) / ED DIAGNOSES  Final diagnoses:  Kidney stone     MEDICATIONS GIVEN DURING THIS VISIT:  Medications  morphine 2 MG/ML injection 2 mg (2 mg Intravenous Given 06/26/19 2027)  iohexol (OMNIPAQUE) 300 MG/ML solution 100 mL (100 mLs Intravenous Contrast Given 06/26/19 2229)     ED Discharge Orders         Ordered    ketorolac (TORADOL) 10 MG tablet  Every 6 hours  PRN     06/26/19 2338          *Please note:  Vanessa Rivera was evaluated in Emergency Department on 06/27/2019 for the symptoms described in the history of present illness. She was evaluated in the context of the global COVID-19 pandemic, which necessitated consideration that the patient might be at risk for infection with the SARS-CoV-2 virus that causes COVID-19. Institutional protocols and algorithms that pertain to the evaluation of patients at risk for COVID-19 are in a state of rapid change based on information released by regulatory bodies including the CDC and federal and state organizations. These policies and algorithms were followed during the patient's care in the ED.  Some ED evaluations and interventions may be delayed as a result of limited staffing during the pandemic.*  Note:  This document was prepared using Dragon voice recognition  software and may include unintentional dictation errors.   Gregor Hams, MD 06/27/19 [phone]

## 2019-06-28 LAB — URINE CULTURE: Culture: NO GROWTH

## 2019-07-01 LAB — CULTURE, BLOOD (ROUTINE X 2)
Culture: NO GROWTH
Culture: NO GROWTH
Special Requests: ADEQUATE

## 2019-08-22 ENCOUNTER — Other Ambulatory Visit: Payer: Self-pay

## 2019-08-22 ENCOUNTER — Emergency Department: Payer: Medicaid Other

## 2019-08-22 ENCOUNTER — Encounter: Payer: Self-pay | Admitting: Emergency Medicine

## 2019-08-22 ENCOUNTER — Emergency Department
Admission: EM | Admit: 2019-08-22 | Discharge: 2019-08-22 | Disposition: A | Discharge status: Home / Self Care | Payer: Medicaid Other | Attending: Emergency Medicine | Admitting: Emergency Medicine

## 2019-08-22 DIAGNOSIS — N12 Tubulo-interstitial nephritis, not specified as acute or chronic: Secondary | ICD-10-CM | POA: Insufficient documentation

## 2019-08-22 DIAGNOSIS — R109 Unspecified abdominal pain: Secondary | ICD-10-CM | POA: Diagnosis present

## 2019-08-22 DIAGNOSIS — F17210 Nicotine dependence, cigarettes, uncomplicated: Secondary | ICD-10-CM | POA: Insufficient documentation

## 2019-08-22 LAB — URINALYSIS, ROUTINE W REFLEX MICROSCOPIC
Bilirubin Urine: NEGATIVE
Glucose, UA: NEGATIVE mg/dL
Ketones, ur: NEGATIVE mg/dL
Nitrite: POSITIVE — AB
Protein, ur: 30 mg/dL — AB
Specific Gravity, Urine: 1.016 (ref 1.005–1.030)
WBC, UA: >50 WBC/hpf — ABNORMAL HIGH (ref 0–5)
pH: 6 (ref 5.0–8.0)

## 2019-08-22 LAB — T4, FREE: Free T4: 0.76 ng/dL (ref 0.61–1.12)

## 2019-08-22 LAB — CBC WITH DIFFERENTIAL/PLATELET
Abs Immature Granulocytes: 0.07 10*3/uL (ref 0.00–0.07)
Basophils Absolute: 0 10*3/uL (ref 0.0–0.1)
Basophils Relative: 0 %
Eosinophils Absolute: 0 10*3/uL (ref 0.0–0.5)
Eosinophils Relative: 0 %
HCT: 38.9 % (ref 36.0–46.0)
Hemoglobin: 12.2 g/dL (ref 12.0–15.0)
Immature Granulocytes: 1 %
Lymphocytes Relative: 9 %
Lymphs Abs: 1.3 10*3/uL (ref 0.7–4.0)
MCH: 24.7 pg — ABNORMAL LOW (ref 26.0–34.0)
MCHC: 31.4 g/dL (ref 30.0–36.0)
MCV: 78.9 fL — ABNORMAL LOW (ref 80.0–100.0)
Monocytes Absolute: 0.6 10*3/uL (ref 0.1–1.0)
Monocytes Relative: 4 %
Neutro Abs: 13 10*3/uL — ABNORMAL HIGH (ref 1.7–7.7)
Neutrophils Relative %: 86 %
Platelets: 242 10*3/uL (ref 150–400)
RBC: 4.93 MIL/uL (ref 3.87–5.11)
RDW: 16 % — ABNORMAL HIGH (ref 11.5–15.5)
WBC: 15.1 10*3/uL — ABNORMAL HIGH (ref 4.0–10.5)
nRBC: 0 % (ref 0.0–0.2)

## 2019-08-22 LAB — TSH: TSH: 0.037 u[IU]/mL — ABNORMAL LOW (ref 0.350–4.500)

## 2019-08-22 LAB — COMPREHENSIVE METABOLIC PANEL
ALT: 11 U/L (ref 0–44)
AST: 18 U/L (ref 15–41)
Albumin: 3.2 g/dL — ABNORMAL LOW (ref 3.5–5.0)
Alkaline Phosphatase: 98 U/L (ref 38–126)
Anion gap: 13 (ref 5–15)
BUN: 13 mg/dL (ref 6–20)
CO2: 20 mmol/L — ABNORMAL LOW (ref 22–32)
Calcium: 8.4 mg/dL — ABNORMAL LOW (ref 8.9–10.3)
Chloride: 100 mmol/L (ref 98–111)
Creatinine, Ser: 0.76 mg/dL (ref 0.44–1.00)
GFR calc Af Amer: >60 mL/min (ref >60)
GFR calc non Af Amer: >60 mL/min (ref >60)
Glucose, Bld: 174 mg/dL — ABNORMAL HIGH (ref 70–99)
Potassium: 2.8 mmol/L — ABNORMAL LOW (ref 3.5–5.1)
Sodium: 133 mmol/L — ABNORMAL LOW (ref 135–145)
Total Bilirubin: 0.6 mg/dL (ref 0.3–1.2)
Total Protein: 8 g/dL (ref 6.5–8.1)

## 2019-08-22 LAB — PROTIME-INR
INR: 1.2 (ref 0.8–1.2)
Prothrombin Time: 15.5 seconds — ABNORMAL HIGH (ref 11.4–15.2)

## 2019-08-22 LAB — LACTIC ACID, PLASMA: Lactic Acid, Venous: 2.2 mmol/L (ref 0.5–1.9)

## 2019-08-22 LAB — POCT PREGNANCY, URINE: Preg Test, Ur: NEGATIVE

## 2019-08-22 LAB — APTT: aPTT: 37 seconds — ABNORMAL HIGH (ref 24–36)

## 2019-08-22 MED ORDER — CIPROFLOXACIN HCL 500 MG PO TABS: 500.0000 mg | ORAL_TABLET | Freq: Two times a day (BID) | ORAL | 0 refills | Status: AC

## 2019-08-22 MED ORDER — SODIUM CHLORIDE 0.9 % IV SOLN
Freq: Once | INTRAVENOUS | Status: AC
  Administered 2019-08-22: 10:00:00 via INTRAVENOUS

## 2019-08-22 MED ORDER — MORPHINE SULFATE (PF) 4 MG/ML IV SOLN
4.0000 mg | Freq: Once | INTRAVENOUS | Status: AC
  Administered 2019-08-22: 10:00:00 4 mg via INTRAVENOUS
  Filled 2019-08-22: qty 1

## 2019-08-22 MED ORDER — POTASSIUM CHLORIDE CRYS ER 20 MEQ PO TBCR
40.0000 meq | EXTENDED_RELEASE_TABLET | Freq: Once | ORAL | Status: AC
  Administered 2019-08-22: 12:00:00 40 meq via ORAL
  Filled 2019-08-22: qty 2

## 2019-08-22 MED ORDER — OXYCODONE-ACETAMINOPHEN 5-325 MG PO TABS: 1.0000 | ORAL_TABLET | Freq: Three times a day (TID) | ORAL | 0 refills | Status: DC | PRN

## 2019-08-22 MED ORDER — SODIUM CHLORIDE 0.9 % IV SOLN
2.0000 g | Freq: Once | INTRAVENOUS | Status: AC
  Administered 2019-08-22: 11:00:00 2 g via INTRAVENOUS
  Filled 2019-08-22: qty 20

## 2019-08-22 NOTE — ED Notes (Signed)
Pt alert and oriented X4, NAD noted  

## 2019-08-22 NOTE — ED Triage Notes (Signed)
Pt c/o LFT flank pain x3days. Hx of kidney stones. States fever of 103 at home.

## 2019-08-22 NOTE — ED Provider Notes (Signed)
[facility] Emergency Department Provider Note       Time seen: ----------------------------------------- 9:19 AM on 08/22/2019 -----------------------------------------   I have reviewed the triage vital signs and the nursing notes.  HISTORY   Chief Complaint Flank Pain    HPI Vanessa Rivera is a 33 y.o. female with a history of amphetamine use disorder, anxiety, goiter, hyperthyroidism who presents to the ED for left flank pain for the past 3 days.  Patient reports she does have a history of kidney stones and had a fever of 103 at home.  She presents tachycardic as well.  Past Medical History:  Diagnosis Date  . Amphetamine use disorder, moderate (HCC)   . Anxiety and depression   . Goiter   . Hyperthyroidism   . Thyroid disease   . Thyroiditis   . Tobacco use affecting pregnancy in third trimester, antepartum     Patient Active Problem List   Diagnosis Date Noted  . Placental abruption affecting delivery 04/07/2019  . Preterm premature rupture of membranes (PPROM) delivered, current hospitalization 04/07/2019  . Fetal tachycardia affecting care of mother, delivered 04/07/2019  . Tachycardia with greater than 160 beats per minute 04/07/2019  . Normal vaginal delivery 04/07/2019  . Postpartum care following vaginal delivery 04/07/2019  . Vaginal bleeding during pregnancy 04/06/2019  . Goiter 10/08/2017  . Hyperthyroidism 10/08/2017  . Lymphadenopathy 10/08/2017  . Migraine with aura 01/05/2005    History reviewed. No pertinent surgical history.  Allergies Penicillins  Social History Social History   Tobacco Use  . Smoking status: Current Every Day Smoker    Packs/day: 1.00    Years: 7.00    Pack years: 7.00    Types: Cigarettes  . Smokeless tobacco: Never Used  Substance Use Topics  . Alcohol use: Not Currently  . Drug use: Not Currently    Types: Methamphetamines    Comment: past opioid, cocaine and alcohol use; last used  meth around lunch today 04/06/19   Review of Systems Constitutional: Positive for fever Cardiovascular: Negative for chest pain. Respiratory: Negative for shortness of breath. Gastrointestinal: Positive for left flank pain Musculoskeletal: Negative for back pain. Skin: Negative for rash. Neurological: Negative for headaches, focal weakness or numbness.  All systems negative/normal/unremarkable except as stated in the HPI  ____________________________________________   PHYSICAL EXAM:  VITAL SIGNS: ED Triage Vitals  Enc Vitals Group     BP 08/22/19 0913 130/69     Pulse Rate 08/22/19 0913 (!) 148     Resp 08/22/19 0913 20     Temp 08/22/19 0913 99.5 F (37.5 C)     Temp Source 08/22/19 0913 Oral     SpO2 08/22/19 0913 99 %     Weight 08/22/19 0912 130 lb (59 kg)     Height 08/22/19 0912 5\' 3"  (1.6 m)     Head Circumference --      Peak Flow --      Pain Score 08/22/19 0911 9     Pain Loc --      Pain Edu? --      Excl. in GC? --    Constitutional: Alert and oriented.  Mild to moderate distress Eyes: Conjunctivae are normal. Normal extraocular movements. ENT      Head: Normocephalic and atraumatic.      Nose: No congestion/rhinnorhea.      Mouth/Throat: Mucous membranes are moist.      Neck: No stridor. Cardiovascular: Rapid rate, regular rhythm. No murmurs, rubs, or gallops. Respiratory:  Normal respiratory effort without tachypnea nor retractions. Breath sounds are clear and equal bilaterally. No wheezes/rales/rhonchi. Gastrointestinal: Left flank tenderness, no rebound or guarding.  Normal bowel sounds. Musculoskeletal: Nontender with normal range of motion in extremities. No lower extremity tenderness nor edema. Neurologic:  Normal speech and language. No gross focal neurologic deficits are appreciated.  Skin:  Skin is warm, dry and intact. No rash noted. Psychiatric: Mood and affect are normal. Speech and behavior are normal.   ____________________________________________  EKG: Interpreted by me.  Sinus tachycardia with a rate of 134 bpm, nonspecific T wave abnormalities, normal axis, normal QT  ____________________________________________  ED COURSE:  As part of my medical decision making, I reviewed the following data within the electronic MEDICAL RECORD NUMBER History obtained from family if available, nursing notes, old chart and ekg, as well as notes from prior ED visits. Patient presented for flank pain as well as fever and tachycardia, we will assess with labs and imaging as indicated at this time. Clinical Course as of Aug 22 1047  Fri Aug 22, 2019  1047 Patient with likely pyelonephritis, I will give IV Rocephin.   [JW]    Clinical Course User Index [JW] Emily Filbert, MD   Procedures  Vanessa Risen was evaluated in Emergency Department on 08/22/2019 for the symptoms described in the history of present illness. She was evaluated in the context of the global COVID-19 pandemic, which necessitated consideration that the patient might be at risk for infection with the SARS-CoV-2 virus that causes COVID-19. Institutional protocols and algorithms that pertain to the evaluation of patients at risk for COVID-19 are in a state of rapid change based on information released by regulatory bodies including the CDC and federal and state organizations. These policies and algorithms were followed during the patient's care in the ED.  ____________________________________________   LABS (pertinent positives/negatives)  Labs Reviewed  LACTIC ACID, PLASMA - Abnormal; Notable for the following components:      Result Value   Lactic Acid, Venous 2.2 (*)    All other components within normal limits  COMPREHENSIVE METABOLIC PANEL - Abnormal; Notable for the following components:   Sodium 133 (*)    Potassium 2.8 (*)    CO2 20 (*)    Glucose, Bld 174 (*)    Calcium 8.4 (*)    Albumin 3.2 (*)    All other  components within normal limits  CBC WITH DIFFERENTIAL/PLATELET - Abnormal; Notable for the following components:   WBC 15.1 (*)    MCV 78.9 (*)    MCH 24.7 (*)    RDW 16.0 (*)    Neutro Abs 13.0 (*)    All other components within normal limits  APTT - Abnormal; Notable for the following components:   aPTT 37 (*)    All other components within normal limits  PROTIME-INR - Abnormal; Notable for the following components:   Prothrombin Time 15.5 (*)    All other components within normal limits  URINALYSIS, ROUTINE W REFLEX MICROSCOPIC - Abnormal; Notable for the following components:   Color, Urine YELLOW (*)    APPearance CLOUDY (*)    Hgb urine dipstick LARGE (*)    Protein, ur 30 (*)    Nitrite POSITIVE (*)    Leukocytes,Ua MODERATE (*)    WBC, UA >50 (*)    Bacteria, UA MANY (*)    All other components within normal limits  TSH - Abnormal; Notable for the following components:   TSH 0.037 (*)  All other components within normal limits  CULTURE, BLOOD (ROUTINE X 2)  CULTURE, BLOOD (ROUTINE X 2)  URINE CULTURE  T4, FREE  LACTIC ACID, PLASMA  POCT PREGNANCY, URINE  POC URINE PREG, ED    RADIOLOGY Images were viewed by me  CT renal protocol IMPRESSION:  1. Bilateral nephrolithiasis. No evidence of ureteral or bladder  calculus. No hydronephrosis.  2. There is mild nonspecific perinephric soft tissue stranding on  the left which could be secondary to inflammation or a recently  passed calculus. Correlation with urine analysis recommended.  3. No other significant findings.  ____________________________________________   DIFFERENTIAL DIAGNOSIS   Renal colic, UTI, pyelonephritis, muscle strain, hyperthyroidism  FINAL ASSESSMENT AND PLAN  Left flank pain, pyelonephritis   Plan: The patient had presented for severe left flank pain with fever and chills. Patient's labs revealed leukocytosis as well as significant urinary tract infection. Patient's imaging revealed  bilateral nephrolithiasis but no ureteral calculus at this time.  There is left-sided perinephric stranding consistent with pyelonephritis.  She was given 2 g IV Rocephin and I recommended hospital admission.  She has declined because of childcare issues.  She will be discharged home with Cipro and pain medicine and is advised to return for worsening or worrisome symptoms.   Laurence Aly, MD    Note: This note was generated in part or whole with voice recognition software. Voice recognition is usually quite accurate but there are transcription errors that can and very often do occur. I apologize for any typographical errors that were not detected and corrected.     Earleen Newport, MD 08/22/19 [phone]

## 2019-08-23 ENCOUNTER — Telehealth: Payer: Self-pay | Admitting: Medical Oncology

## 2019-08-23 LAB — BLOOD CULTURE ID PANEL (REFLEXED)

## 2019-08-23 NOTE — Telephone Encounter (Signed)
Home phone listed is wrong number- was attempting to call patient to tell her she needs to return to ED d/t positive BC.

## 2019-08-23 NOTE — Progress Notes (Signed)
PHARMACY - PHYSICIAN COMMUNICATION CRITICAL VALUE ALERT - BLOOD CULTURE IDENTIFICATION (BCID)  Vanessa Rivera is an 33 y.o. female who presented to [facility] on 08/22/2019 with a chief complaint of fever  Assessment:  Lab reported 1 of 2 bottles w/ E Coli, Enterobactereacea.    ED note:  Plan: The patient had presented for severe left flank pain with fever and chills. Patient's labs revealed leukocytosis as well as significant urinary tract infection. Patient's imaging revealed bilateral nephrolithiasis but no ureteral calculus at this time.  There is left-sided perinephric stranding consistent with pyelonephritis.  She was given 2 g IV Rocephin and I recommended hospital admission.  She has declined because of childcare issues.  She will be discharged home with Cipro and pain medicine and is advised to return for worsening or worrisome symptoms.  Name of physician (or Provider) Contacted: Dr Owens Shark EDP  Current antibiotics: Given Rocephin in ED and discharged w/ Cipro  Charge nurse to call pt and ask her to report back to ED   Results for orders placed or performed during the hospital encounter of 08/22/19  Blood Culture ID Panel (Reflexed) (Collected: 08/22/2019  9:34 AM)  Result Value Ref Range   Enterococcus species NOT DETECTED NOT DETECTED   Listeria monocytogenes NOT DETECTED NOT DETECTED   Staphylococcus species NOT DETECTED NOT DETECTED   Staphylococcus aureus (BCID) NOT DETECTED NOT DETECTED   Streptococcus species NOT DETECTED NOT DETECTED   Streptococcus agalactiae NOT DETECTED NOT DETECTED   Streptococcus pneumoniae NOT DETECTED NOT DETECTED   Streptococcus pyogenes NOT DETECTED NOT DETECTED   Acinetobacter baumannii NOT DETECTED NOT DETECTED   Enterobacteriaceae species DETECTED (A) NOT DETECTED   Enterobacter cloacae complex NOT DETECTED NOT DETECTED   Escherichia coli DETECTED (A) NOT DETECTED   Klebsiella oxytoca NOT DETECTED NOT DETECTED   Klebsiella pneumoniae NOT  DETECTED NOT DETECTED   Proteus species NOT DETECTED NOT DETECTED   Serratia marcescens NOT DETECTED NOT DETECTED   Carbapenem resistance NOT DETECTED NOT DETECTED   Haemophilus influenzae NOT DETECTED NOT DETECTED   Neisseria meningitidis NOT DETECTED NOT DETECTED   Pseudomonas aeruginosa NOT DETECTED NOT DETECTED   Candida albicans NOT DETECTED NOT DETECTED   Candida glabrata NOT DETECTED NOT DETECTED   Candida krusei NOT DETECTED NOT DETECTED   Candida parapsilosis NOT DETECTED NOT DETECTED   Candida tropicalis NOT DETECTED NOT DETECTED    Hart Robinsons A 08/23/2019  5:03 AM

## 2019-08-24 LAB — URINE CULTURE: Culture: >=100000 — AB

## 2019-08-25 LAB — CULTURE, BLOOD (ROUTINE X 2): Special Requests: ADEQUATE

## 2019-08-26 NOTE — Progress Notes (Signed)
Brief Pharmacy Note  Pharmacist reviewed ED culture report. Patient with E. coli bacteremia discharged on ciprofloxacin 500 mg BID x 10 days for suspected UTI. Attempt to contact patient 08/23/19 unsuccessful. Sensitivities resulted 9/7 with blood cultures susceptible to ciprofloxacin.  Discharge order and length of therapy appropriate. No additional follow up at this time.  Dorena Bodo, PharmD

## 2019-08-27 LAB — CULTURE, BLOOD (ROUTINE X 2): Culture: NO GROWTH

## 2019-08-27 NOTE — Telephone Encounter (Signed)
Called to check on patient due to positive blood culture several days ago and unable to contact.  I left a message again.

## 2019-10-09 ENCOUNTER — Other Ambulatory Visit: Payer: Self-pay

## 2019-10-09 ENCOUNTER — Ambulatory Visit (LOCAL_COMMUNITY_HEALTH_CENTER): Payer: Medicaid Other | Admitting: Advanced Practice Midwife

## 2019-10-09 ENCOUNTER — Encounter: Payer: Self-pay | Admitting: Advanced Practice Midwife

## 2019-10-09 VITALS — BP 114/73 | Ht 64.0 in | Wt 134.0 lb

## 2019-10-09 DIAGNOSIS — Z3041 Encounter for surveillance of contraceptive pills: Secondary | ICD-10-CM

## 2019-10-09 DIAGNOSIS — Z30011 Encounter for initial prescription of contraceptive pills: Secondary | ICD-10-CM

## 2019-10-09 DIAGNOSIS — F17200 Nicotine dependence, unspecified, uncomplicated: Secondary | ICD-10-CM | POA: Insufficient documentation

## 2019-10-09 DIAGNOSIS — Z3009 Encounter for other general counseling and advice on contraception: Secondary | ICD-10-CM | POA: Diagnosis not present

## 2019-10-09 LAB — WET PREP FOR TRICH, YEAST, CLUE
Trichomonas Exam: NEGATIVE
Yeast Exam: NEGATIVE

## 2019-10-09 MED ORDER — NORETHINDRONE 0.35 MG PO TABS: 1.0000 | ORAL_TABLET | Freq: Every day | ORAL | 2 refills | Status: DC

## 2019-10-09 NOTE — Progress Notes (Addendum)
Here today for birth control refill. Per [facility] last RP was 06/19/2018 and last Pap Smear was 03/07/2004 with normal results. Hal Morales, RN Wet Prep results reviewed. Per standing orders no treatment indicated. Preferred pharmacy updated. Hal Morales, RN

## 2019-10-09 NOTE — Progress Notes (Signed)
   [place] problem visit  [place] Department  Subjective:  Vanessa Rivera is a 33 y.o. G3P3 smoker being seen today for refill of OTC  Chief Complaint  Patient presents with  . Contraception    HPI Dx migraines with aura.  Pt states with initiation of OTC her migraines have been sl more "intense" but not increased in frequency. LMP 09/25/19.  Last sex yesterday with condom.  Smoker  Does the patient have a current or past history of drug use? No   No components found for: HCV]   Health Maintenance Due  Topic Date Due  . PAP SMEAR-Modifier  09/24/2007  . INFLUENZA VACCINE  07/19/2019    ROS  The following portions of the patient's history were reviewed and updated as appropriate: allergies, current medications, past family history, past medical history, past social history, past surgical history and problem list. Problem list updated.   See flowsheet for other program required questions.  Objective:   Vitals:   10/09/19 1110  BP: 114/73  Weight: 134 lb (60.8 kg)  Height: 5\' 4"  (1.626 m)    Physical Exam Constitutional:      Appearance: Normal appearance. She is normal weight.  Eyes:     Conjunctiva/sclera: Conjunctivae normal.  Pulmonary:     Effort: Pulmonary effort is normal.     Breath sounds: Normal breath sounds.  Abdominal:     Palpations: Abdomen is soft.  Genitourinary:    General: Normal vulva.     Exam position: Lithotomy position.     Labia:        Right: No lesion.        Left: No lesion.      Vagina: Vaginal discharge (small amt white creamy leukorrhea,ph<4.5) present.     Cervix: Normal.     Uterus: Normal.      Adnexa: Right adnexa normal and left adnexa normal.     Rectum: Normal.     Comments: Pap done Skin:    General: Skin is warm and dry.  Neurological:     Mental Status: She is alert.       Assessment and Plan:  Vanessa Rivera is a 33 y.o. female presenting to the [place]  Department for a Women's Health problem visit  1. Smoker 1-1 1/2 ppd Counseled via 5 A's to stop smoking  2. Family planning Treat wet mount per standing orders - WET PREP FOR TRICH, YEAST, CLUE - IGP, Aptima HPV  3. Encounter for surveillance of contraceptive pills Counseled pt on age, smoking status, migraines with aura that I recommend changing to Micronor Please give pt pamphlets on Mirena and Paraguard--pt counseled on this may be a better option for her Last CBE and physical 06/19/2018 Last pap 03/07/2004 wnl     Return in about 3 months (around 01/09/2020).  No future appointments.  Herbie Saxon, CNM

## 2019-10-16 ENCOUNTER — Encounter: Payer: Self-pay | Admitting: Advanced Practice Midwife

## 2019-10-16 DIAGNOSIS — R87619 Unspecified abnormal cytological findings in specimens from cervix uteri: Secondary | ICD-10-CM | POA: Insufficient documentation

## 2019-10-16 LAB — IGP, APTIMA HPV
HPV Aptima: POSITIVE — AB
PAP Smear Comment: 0

## 2020-01-02 IMAGING — CT CT ABDOMEN AND PELVIS WITH CONTRAST
2 of 4 series · 16 of 46 positions shown, 18 images · IV contrast (APPLIED)
Comparison: None.

CLINICAL DATA: Fever and chills with pelvic pressure

EXAM:
CT ABDOMEN AND PELVIS WITH CONTRAST
TECHNIQUE: Multidetector CT imaging of the abdomen and pelvis was performed
using the standard protocol following bolus administration of
intravenous contrast.
CONTRAST:  100mL OMNIPAQUE IOHEXOL 300 MG/ML  SOLN

[Series 2: routine abd/pel with · axial · 0.70mm/px · z∈[+649,+1034]mm · 13 of 85 slices shown, 15 images]
[im 4/85  soft-tissue]
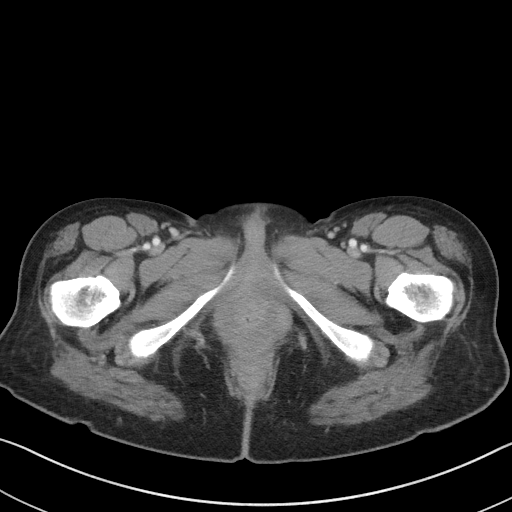
[im 4/85  bone]
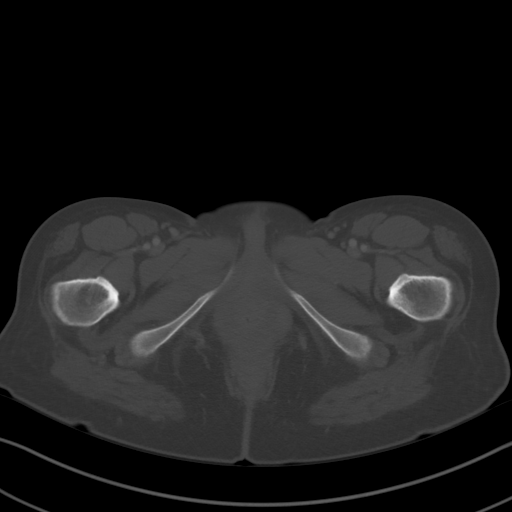
[im 11/85  soft-tissue]
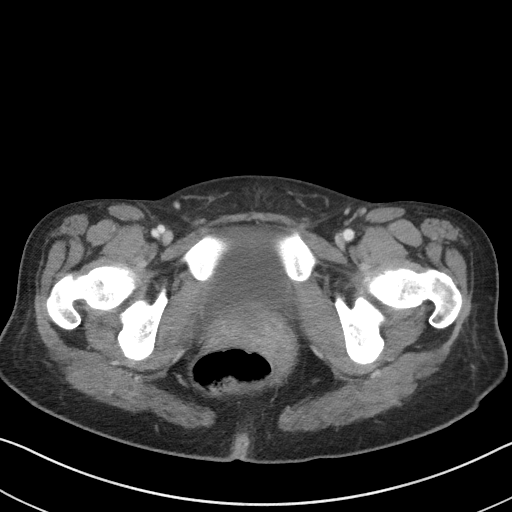
[im 17/85  soft-tissue]
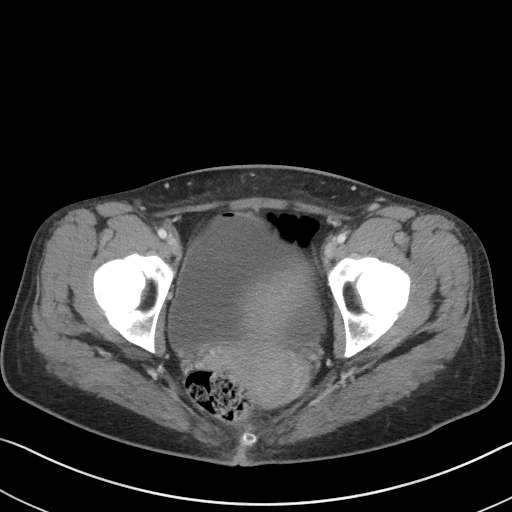
[im 24/85  soft-tissue]
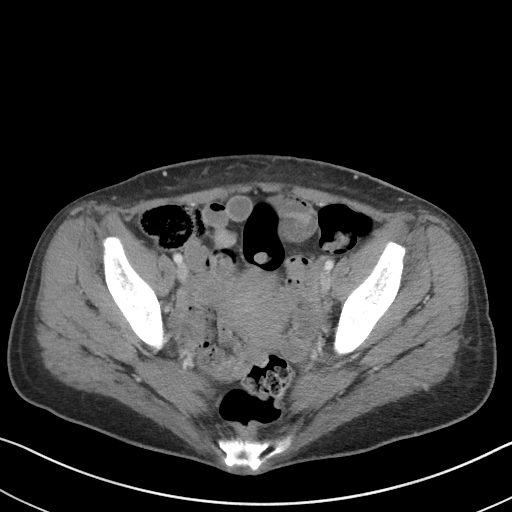
[im 31/85  soft-tissue]
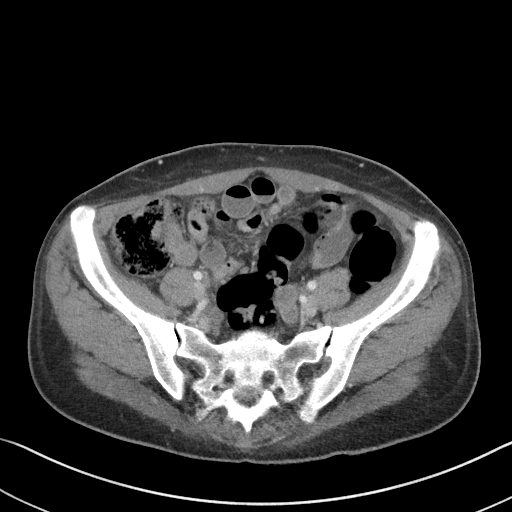
[im 37/85  soft-tissue]
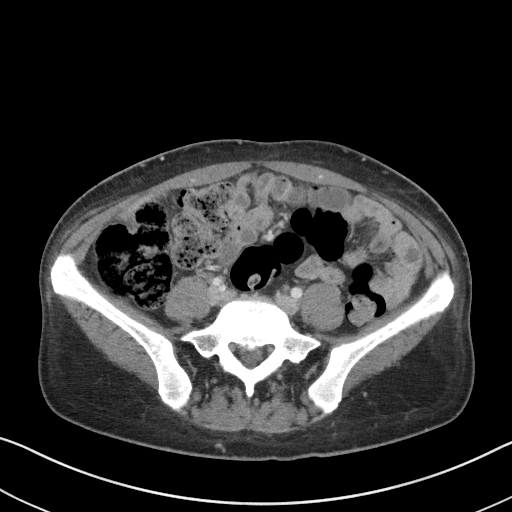
[im 44/85  soft-tissue]
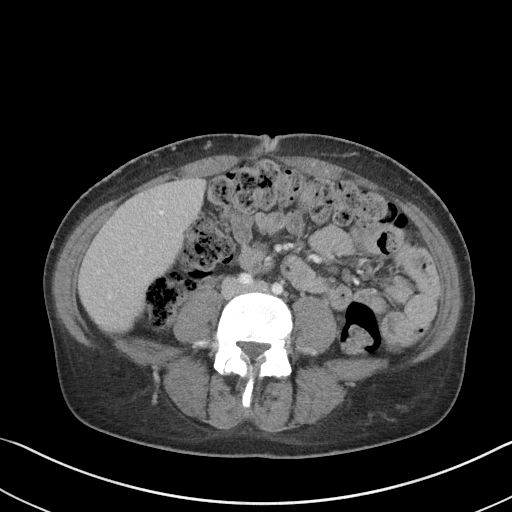
[im 48/85  soft-tissue]
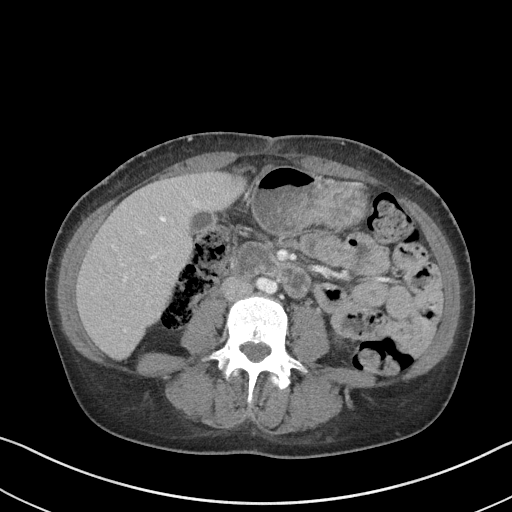
[im 54/85  soft-tissue]
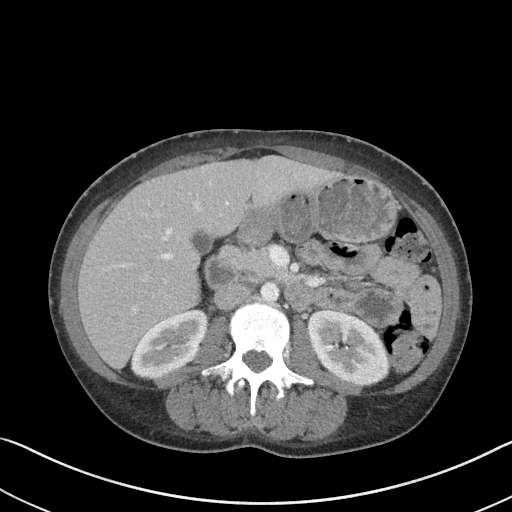
[im 54/85  bone]
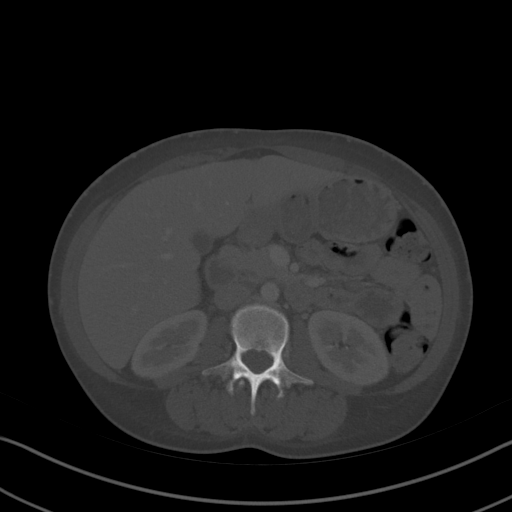
[im 61/85  soft-tissue]
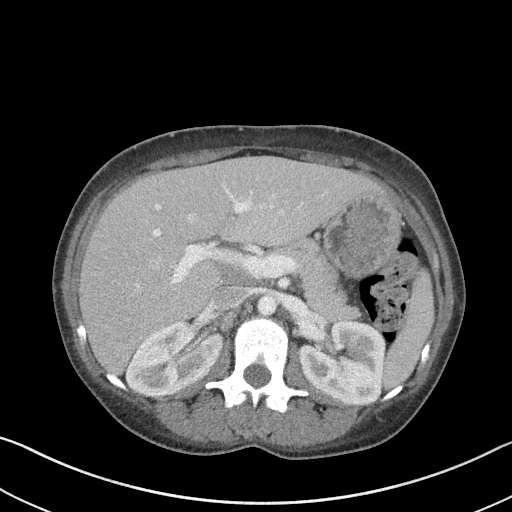
[im 68/85  soft-tissue]
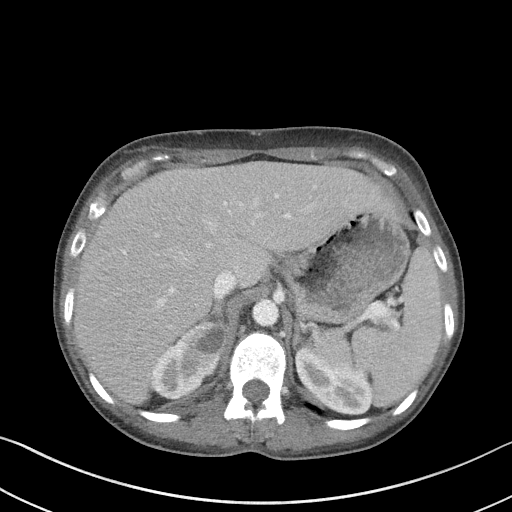
[im 74/85  soft-tissue]
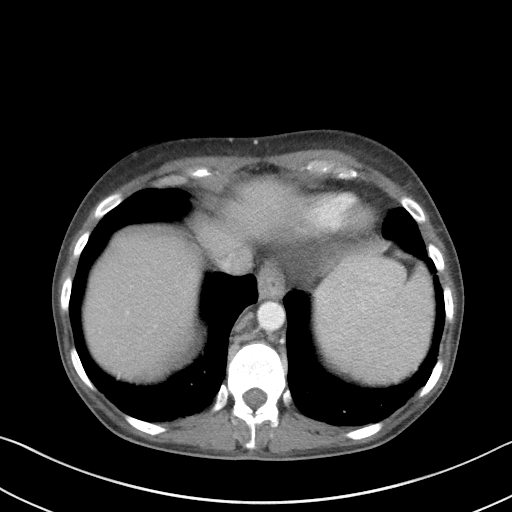
[im 81/85  soft-tissue]
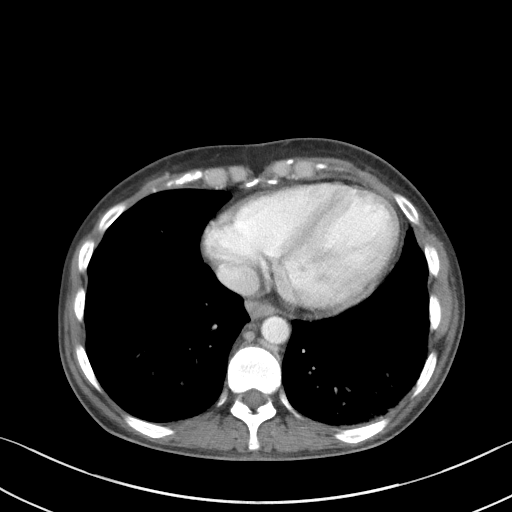

[Series 5: coronal st · coronal · 0.72mm/px · 3 of 78 slices shown]
[im 26/78  soft-tissue]
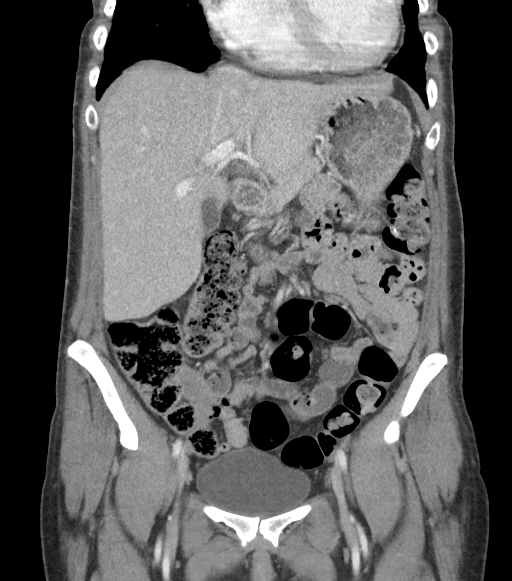
[im 35/78  soft-tissue]
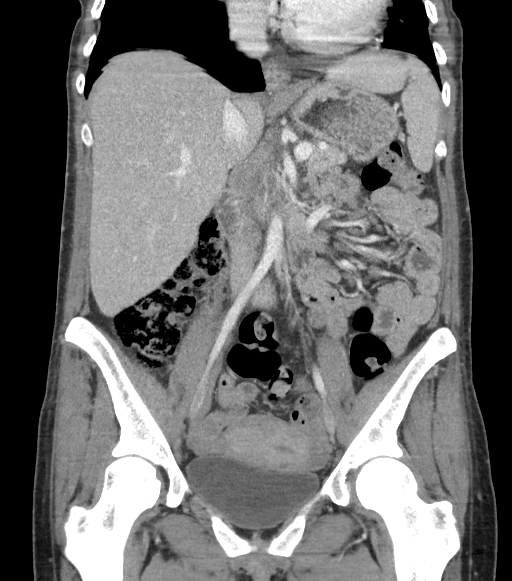
[im 43/78  soft-tissue]
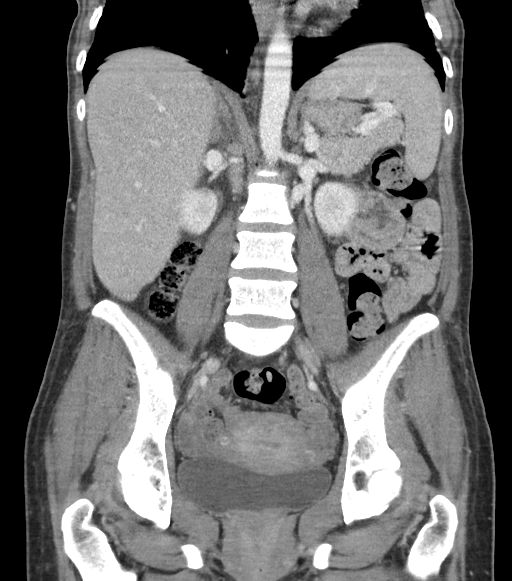

[16 of 46 positions shown; findings below may reference images not displayed]

FINDINGS: Lower chest: No acute abnormality.

Hepatobiliary: No focal liver abnormality is seen. No gallstones,
gallbladder wall thickening, or biliary dilatation.

Pancreas: Unremarkable. No pancreatic ductal dilatation or
surrounding inflammatory changes.

Spleen: Normal in size without focal abnormality.

Adrenals/Urinary Tract: Adrenal glands are within normal limits.
Renal cyst is noted in the upper pole of the right kidney. Tiny
renal calculus is noted in the lower pole of the left kidney. No
obstructive changes are seen. Bladder is well distended. A small 4
mm stone is noted within the dependent portion of the bladder of
uncertain chronicity. This may be related to the recent
symptomatology although no hydronephrosis is seen. Tiny focus of air
is noted within the nondependent portion of the bladder. Correlation
with any recent intervention is recommended.

Stomach/Bowel: No obstructive or inflammatory changes of large or
small bowel are seen. The appendix is within normal limits. Small
bowel is unremarkable. No gastric abnormality is seen.

Vascular/Lymphatic: No significant vascular findings are present. No
enlarged abdominal or pelvic lymph nodes.

Reproductive: Uterus and bilateral adnexa are unremarkable.

Other: No abdominal wall hernia or abnormality. No abdominopelvic
ascites.

Musculoskeletal: No acute or significant osseous findings.
IMPRESSION: 4 mm stone in the dependent portion of the bladder likely related to
recently passed stone. This is of uncertain chronicity.

Small focus of air is noted within the bladder. This may be related
to recent instrumentation.

Tiny nonobstructing left renal stone.

## 2020-02-22 ENCOUNTER — Telehealth: Payer: Self-pay | Admitting: General Practice

## 2020-02-22 NOTE — Telephone Encounter (Signed)
Patient would like to speak with nurse in regards to testing positive for covid and then being exposed to a positive person. Patient wants to know does she need to self isolate for another 10 days. Please advise

## 2020-02-22 NOTE — Telephone Encounter (Signed)
Advised pt that she does not have to re isolate. If she develops sx asked pt to call her PCP to see if testing is warranted. Pt verbalized understanding.

## 2020-04-25 ENCOUNTER — Emergency Department
Admission: EM | Admit: 2020-04-25 | Discharge: 2020-04-25 | Disposition: A | Discharge status: Home / Self Care | Payer: Medicaid Other | Attending: Emergency Medicine | Admitting: Emergency Medicine

## 2020-04-25 ENCOUNTER — Other Ambulatory Visit: Payer: Self-pay

## 2020-04-25 DIAGNOSIS — R21 Rash and other nonspecific skin eruption: Secondary | ICD-10-CM | POA: Diagnosis present

## 2020-04-25 DIAGNOSIS — Z79899 Other long term (current) drug therapy: Secondary | ICD-10-CM | POA: Diagnosis not present

## 2020-04-25 DIAGNOSIS — L237 Allergic contact dermatitis due to plants, except food: Secondary | ICD-10-CM | POA: Insufficient documentation

## 2020-04-25 DIAGNOSIS — L259 Unspecified contact dermatitis, unspecified cause: Secondary | ICD-10-CM

## 2020-04-25 DIAGNOSIS — F17210 Nicotine dependence, cigarettes, uncomplicated: Secondary | ICD-10-CM | POA: Insufficient documentation

## 2020-04-25 MED ORDER — DEXAMETHASONE SODIUM PHOSPHATE 10 MG/ML IJ SOLN
10.0000 mg | Freq: Once | INTRAMUSCULAR | Status: AC
  Administered 2020-04-25: 10 mg via INTRAVENOUS
  Filled 2020-04-25: qty 1

## 2020-04-25 MED ORDER — PREDNISONE 10 MG (48) PO TBPK: ORAL_TABLET | ORAL | 0 refills | Status: DC

## 2020-04-25 NOTE — ED Triage Notes (Signed)
Patient reports probable poison ivy/oak since last week.  Reports having rash to chest and upper thighs.

## 2020-04-25 NOTE — Discharge Instructions (Addendum)
Follow-up with your regular doctor if not improving in 3 to 4 days.  Return emergency department if worsening.  Take your medication as prescribed.  Prednisone may make you flushed, sweaty, and irritable

## 2020-04-25 NOTE — ED Provider Notes (Signed)
[facility] Emergency Department Provider Note  ____________________________________________   First MD Initiated Contact with Patient 04/25/20 2003     (approximate)  I have reviewed the triage vital signs and the nursing notes.   HISTORY  Chief Complaint Poison Ivy    HPI Vanessa Rivera is a 34 y.o. female presents emergency department complaining of poison ivy on her chest, thighs, arms and neck.  Patient states she has had it since Monday.  Using over-the-counter medications without any relief.  No fever or chills.    Past Medical History:  Diagnosis Date   Amphetamine use disorder, moderate (HCC)    Anxiety and depression    Goiter    Hyperthyroidism    Thyroid disease    Thyroiditis    Tobacco use affecting pregnancy in third trimester, antepartum     Patient Active Problem List   Diagnosis Date Noted   Abnormal Pap smear of cervix 10/09/19 neg +HPV 10/16/2019   Smoker 1-1 1/2 ppd 10/09/2019   Placental abruption affecting delivery 04/07/2019   Preterm premature rupture of membranes (PPROM) delivered, current hospitalization 04/07/2019   Fetal tachycardia affecting care of mother, delivered 04/07/2019   Tachycardia with greater than 160 beats per minute 04/07/2019   Normal vaginal delivery 04/07/2019   Postpartum care following vaginal delivery 04/07/2019   Vaginal bleeding during pregnancy 04/06/2019   Goiter 10/08/2017   Hyperthyroidism 10/08/2017   Lymphadenopathy 10/08/2017   Migraine with aura 01/05/2005    No past surgical history on file.  Prior to Admission medications   Medication Sig Start Date End Date Taking? Authorizing Provider  ketorolac (TORADOL) 10 MG tablet Take 1 tablet (10 mg total) by mouth every 6 (six) hours as needed. Patient not taking: Reported on 10/09/2019 06/26/19   Darci Current, MD  lithium 300 MG tablet Take 300 mg by mouth at bedtime. 11/08/18   [provider]    methimazole (TAPAZOLE) 10 MG tablet Take 20 mg by mouth 2 (two) times daily.    [provider]  metoprolol succinate (TOPROL-XL) 50 MG 24 hr tablet Take 50 mg by mouth daily. Take with or immediately following a meal.    [provider]  norethindrone (MICRONOR) 0.35 MG tablet Take 1 tablet (0.35 mg total) by mouth daily. 10/09/19   Sciora, Austin Miles, CNM  Norgestimate-Ethinyl Estradiol Triphasic (ORTHO TRI-CYCLEN, 28,) 0.18/0.215/0.25 MG-35 MCG tablet Take 1 tablet by mouth daily. 06/19/18 06/20/19  Larene Pickett, FNP  oxyCODONE-acetaminophen (PERCOCET) 5-325 MG tablet Take 1 tablet by mouth every 8 (eight) hours as needed. Patient not taking: Reported on 10/09/2019 08/22/19   Emily Filbert, MD  predniSONE (STERAPRED UNI-PAK 48 TAB) 10 MG (48) TBPK tablet Take 6 pills for 2 days, 5 pills x 2d, 4 pills x 2d, 3 pills x 2d, 2 pills x 2d, 1 pill x 2d 04/25/20   Sherrie Mustache Roselyn Bering, PA-C  Prenatal Vit-Fe Fumarate-FA (PNV PRENATAL PLUS MULTIVITAMIN) 27-1 MG TABS Take 1 tablet by mouth daily.    [provider]    Allergies Penicillins  Family History  Problem Relation Age of Onset   Thyroid disease Mother    Bipolar disorder Mother    Thyroid disease Maternal Aunt    Prostate cancer Maternal Grandfather     Social History Social History   Tobacco Use   Smoking status: Current Every Day Smoker    Packs/day: 1.50    Years: 7.00    Pack years: 10.50  Types: Cigarettes   Smokeless tobacco: Never Used  Substance Use Topics   Alcohol use: Not Currently   Drug use: Not Currently    Types: Methamphetamines    Comment: past opioid, cocaine and alcohol use; last used meth around lunch today 04/06/19    Review of Systems  Constitutional: No fever/chills Eyes: No visual changes. ENT: No sore throat. Respiratory: Denies cough Genitourinary: Negative for dysuria. Musculoskeletal: Negative for back pain. Skin: Positive for rash. Psychiatric: no mood  changes,     ____________________________________________   PHYSICAL EXAM:  VITAL SIGNS: ED Triage Vitals  Enc Vitals Group     BP 04/25/20 1920 120/81     Pulse Rate 04/25/20 1920 63     Resp 04/25/20 1920 17     Temp 04/25/20 1920 98.8 F (37.1 C)     Temp Source 04/25/20 1920 Oral     SpO2 04/25/20 1920 94 %     Weight 04/25/20 1921 140 lb (63.5 kg)     Height 04/25/20 1921 5\' 3"  (1.6 m)     Head Circumference --      Peak Flow --      Pain Score 04/25/20 1921 0     Pain Loc --      Pain Edu? --      Excl. in [place]? --     Constitutional: Alert and oriented. Well appearing and in no acute distress. Eyes: Conjunctivae are normal.  Head: Atraumatic. Nose: No congestion/rhinnorhea. Mouth/Throat: Mucous membranes are moist.   Neck:  supple no lymphadenopathy noted Cardiovascular: Normal rate, regular rhythm. Respiratory: Normal respiratory effort.  No retractions, GU: deferred Musculoskeletal: FROM all extremities, warm and well perfused Neurologic:  Normal speech and language.  Skin:  Skin is warm, dry and intact.  Please note be noted on the chest arms and thighs Psychiatric: Mood and affect are normal. Speech and behavior are normal.  ____________________________________________   LABS (all labs ordered are listed, but only abnormal results are displayed)  Labs Reviewed - No data to display ____________________________________________   ____________________________________________  RADIOLOGY    ____________________________________________   PROCEDURES  Procedure(s) performed: Decadron 10 mg IM   Procedures    ____________________________________________   INITIAL IMPRESSION / ASSESSMENT AND PLAN / ED COURSE  Pertinent labs & imaging results that were available during my care of the patient were reviewed by me and considered in my medical decision making (see chart for details).   Patient is a 34 year old female presents emergency department  with poison ivy.  See HPI  Physical exam shows poison ivy on the chest arms and legs.  Explained the findings to the patient.  Is given Decadron 10 mg IM while here in the ED.  Sterapred Dosepak for it starting tomorrow.  She is to return the emergency department worsening.  Follow-up with your regular doctor if not better in 3 days.  States she understands.  Is discharged stable condition.    Vanessa Rivera was evaluated in Emergency Department on 04/25/2020 for the symptoms described in the history of present illness. She was evaluated in the context of the global COVID-19 pandemic, which necessitated consideration that the patient might be at risk for infection with the SARS-CoV-2 virus that causes COVID-19. Institutional protocols and algorithms that pertain to the evaluation of patients at risk for COVID-19 are in a state of rapid change based on information released by regulatory bodies including the CDC and federal and state organizations. These policies and algorithms were followed during  the patient's care in the ED.   As part of my medical decision making, I reviewed the following data within the electronic MEDICAL RECORD NUMBER Nursing notes reviewed and incorporated, Old chart reviewed, Notes from prior ED visits and [place] Controlled Substance Database  ____________________________________________   FINAL CLINICAL IMPRESSION(S) / ED DIAGNOSES  Final diagnoses:  Acute contact dermatitis  Poison ivy      NEW MEDICATIONS STARTED DURING THIS VISIT:  New Prescriptions   PREDNISONE (STERAPRED UNI-PAK 48 TAB) 10 MG (48) TBPK TABLET    Take 6 pills for 2 days, 5 pills x 2d, 4 pills x 2d, 3 pills x 2d, 2 pills x 2d, 1 pill x 2d     Note:  This document was prepared using Dragon voice recognition software and may include unintentional dictation errors.    Faythe Ghee, PA-C 04/25/20 2036    Sharyn Creamer, MD 04/26/20 [phone]

## 2020-04-25 NOTE — ED Notes (Signed)
Pt with itchy rash to tops of thighs and chest x one week that is not resolving. Pt using caladryl without relief.

## 2020-06-28 ENCOUNTER — Encounter: Payer: Self-pay | Admitting: Emergency Medicine

## 2020-06-28 ENCOUNTER — Other Ambulatory Visit: Payer: Self-pay

## 2020-06-28 ENCOUNTER — Emergency Department
Admission: EM | Admit: 2020-06-28 | Discharge: 2020-06-28 | Disposition: A | Discharge status: Home / Self Care | Payer: Medicaid Other | Attending: Emergency Medicine | Admitting: Emergency Medicine

## 2020-06-28 DIAGNOSIS — Z5321 Procedure and treatment not carried out due to patient leaving prior to being seen by health care provider: Secondary | ICD-10-CM | POA: Diagnosis not present

## 2020-06-28 DIAGNOSIS — M542 Cervicalgia: Secondary | ICD-10-CM | POA: Diagnosis present

## 2020-06-28 NOTE — ED Notes (Signed)
Pt noted to not be in room, unable to locate pt in ER or outside

## 2020-06-28 NOTE — ED Provider Notes (Signed)
Patient apparently left without being seen.  Patient had been triaged, placed in an exam room.  I entered the room to find no patient.  After checking back several times without finding patient, it appears that the patient had left without being seen.  I have not seen nor provided care to this patient.   Lanette Hampshire 06/28/20 2108    Sharman Cheek, MD 06/28/20 2325
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
## 2020-06-28 NOTE — ED Notes (Signed)
Attempted to locate pt multiple times, pt has not returned to room. Unable to obtain d/c signature
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
## 2020-09-21 ENCOUNTER — Telehealth: Payer: Self-pay

## 2020-09-21 NOTE — Telephone Encounter (Signed)
Telephone call to patient regarding her previous scheduled appointment this morning for a repeat PAP and physical 09/22/2020 2:40pm (arrival time 2:10 pm).  Left a message that I rescheduled her appointment to 10/11/2020 at the same time due to her Medicaid coverage.  Her last PAP and physical appointment was 10/09/2019.  It must be 365 days for Medicaid to cover so patient does not accumulate the charges/a bill.  MyChart message sent to patient today as well. Hart Carwin, RN

## 2020-09-22 ENCOUNTER — Ambulatory Visit: Payer: Self-pay

## 2020-10-11 ENCOUNTER — Ambulatory Visit: Payer: Medicaid Other

## 2021-01-18 ENCOUNTER — Other Ambulatory Visit: Payer: Self-pay

## 2021-01-18 ENCOUNTER — Encounter: Payer: Self-pay | Admitting: Emergency Medicine

## 2021-01-18 ENCOUNTER — Emergency Department
Admission: EM | Admit: 2021-01-18 | Discharge: 2021-01-18 | Disposition: A | Discharge status: Home / Self Care | Payer: Medicaid Other | Attending: Emergency Medicine | Admitting: Emergency Medicine

## 2021-01-18 DIAGNOSIS — Z5321 Procedure and treatment not carried out due to patient leaving prior to being seen by health care provider: Secondary | ICD-10-CM | POA: Diagnosis not present

## 2021-01-18 DIAGNOSIS — J029 Acute pharyngitis, unspecified: Secondary | ICD-10-CM | POA: Insufficient documentation

## 2021-01-18 NOTE — ED Triage Notes (Signed)
Patient ambulatory to triage with steady gait, without difficulty or distress noted; pt reports sore throat x 2 days; denies any accomp symptoms

## 2021-10-25 ENCOUNTER — Encounter: Payer: Self-pay | Admitting: Emergency Medicine

## 2021-10-27 ENCOUNTER — Other Ambulatory Visit: Payer: Self-pay

## 2021-10-27 ENCOUNTER — Ambulatory Visit: Payer: Medicaid Other | Attending: Family Medicine

## 2021-10-27 DIAGNOSIS — M6281 Muscle weakness (generalized): Secondary | ICD-10-CM | POA: Insufficient documentation

## 2021-10-27 DIAGNOSIS — M545 Low back pain, unspecified: Secondary | ICD-10-CM | POA: Diagnosis present

## 2021-10-27 DIAGNOSIS — R2689 Other abnormalities of gait and mobility: Secondary | ICD-10-CM | POA: Insufficient documentation
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
## 2021-11-03 ENCOUNTER — Ambulatory Visit: Payer: Medicaid Other

## 2021-11-07 ENCOUNTER — Ambulatory Visit: Payer: Medicaid Other

## 2021-11-07 ENCOUNTER — Other Ambulatory Visit: Payer: Self-pay

## 2021-11-07 DIAGNOSIS — M545 Low back pain, unspecified: Secondary | ICD-10-CM | POA: Diagnosis not present

## 2021-11-07 DIAGNOSIS — M6281 Muscle weakness (generalized): Secondary | ICD-10-CM
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
## 2021-11-16 ENCOUNTER — Ambulatory Visit: Payer: Medicaid Other

## 2021-11-23 ENCOUNTER — Other Ambulatory Visit: Payer: Self-pay

## 2021-11-23 ENCOUNTER — Ambulatory Visit: Payer: Medicaid Other | Attending: Family Medicine

## 2021-11-23 DIAGNOSIS — M6281 Muscle weakness (generalized): Secondary | ICD-10-CM | POA: Insufficient documentation
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
## 2021-12-01 ENCOUNTER — Ambulatory Visit: Payer: Medicaid Other

## 2021-12-07 ENCOUNTER — Ambulatory Visit: Payer: Medicaid Other

## 2022-10-12 ENCOUNTER — Ambulatory Visit (INDEPENDENT_AMBULATORY_CARE_PROVIDER_SITE_OTHER): Payer: Medicaid Other

## 2022-10-12 VITALS — BP 117/81 | HR 81 | Wt 197.0 lb

## 2022-10-12 DIAGNOSIS — Z3201 Encounter for pregnancy test, result positive: Secondary | ICD-10-CM | POA: Diagnosis not present

## 2022-10-12 DIAGNOSIS — N926 Irregular menstruation, unspecified: Secondary | ICD-10-CM

## 2022-10-12 LAB — POCT URINE PREGNANCY: Preg Test, Ur: POSITIVE — AB

## 2022-10-12 NOTE — Progress Notes (Signed)
Subjective:    Vanessa Rivera is a 36 y.o. female who presents for evaluation of amenorrhea. She believes she could be pregnant.  Patient was surprised and stated it wasn't planned when I asked if the pregnancy was desired or not.  Last period was normal.    Last LMP: 09/04/2022    Lab Review Urine HCG: positive       Plan:    Pregnancy Test:  Positive: EDC: 06/11/2023. Briefly discussed positive results and sent to check out for scheduling for New OB appointments.

## 2022-10-23 ENCOUNTER — Ambulatory Visit: Payer: Medicaid Other

## 2022-10-28 ENCOUNTER — Emergency Department
Admission: EM | Admit: 2022-10-28 | Discharge: 2022-10-28 | Discharge status: Against Medical Advice | Payer: Medicaid Other | Attending: Student | Admitting: Student

## 2022-10-28 ENCOUNTER — Other Ambulatory Visit: Payer: Self-pay

## 2022-10-28 DIAGNOSIS — R062 Wheezing: Secondary | ICD-10-CM | POA: Insufficient documentation

## 2022-10-28 DIAGNOSIS — Z5321 Procedure and treatment not carried out due to patient leaving prior to being seen by health care provider: Secondary | ICD-10-CM | POA: Insufficient documentation

## 2022-10-28 DIAGNOSIS — O99511 Diseases of the respiratory system complicating pregnancy, first trimester: Secondary | ICD-10-CM | POA: Insufficient documentation

## 2022-10-28 DIAGNOSIS — R059 Cough, unspecified: Secondary | ICD-10-CM | POA: Insufficient documentation

## 2022-10-28 DIAGNOSIS — Z1152 Encounter for screening for COVID-19: Secondary | ICD-10-CM | POA: Insufficient documentation

## 2022-10-28 LAB — RESP PANEL BY RT-PCR (FLU A&B, COVID) ARPGX2
Influenza A by PCR: NEGATIVE
Influenza B by PCR: NEGATIVE
SARS Coronavirus 2 by RT PCR: NEGATIVE

## 2022-10-28 NOTE — ED Triage Notes (Addendum)
Pt comes in for wheezing and dry cough per the patient. Describes it worse at night time. Pt denies N/V/D. Pt denies HX of asthma. Pt in no acute distress. Pt advised been going on for 4 days.

## 2022-10-28 NOTE — ED Provider Triage Note (Cosign Needed)
Emergency Medicine Provider Triage Evaluation Note  Enez Monahan , a 36 y.o. female  was evaluated in triage.  Pt complains of cough and wheezing x4 days. No CP/SOB. No fever. Pregnant [redacted] weeks. No history of blood clots. No leg swelling or calf pain. No pregnancy concerns.   Review of Systems  Positive: cough Negative: Sob, cp, fever  Physical Exam  BP 115/70   Pulse 95   Temp 98.4 F (36.9 C)   Resp 18   Ht 5\' 3"  (1.6 m)   Wt 90.7 kg   LMP 09/04/2022 (Approximate)   SpO2 97%   BMI 35.43 kg/m  Gen:   Awake, no distress   Resp:  Normal effort  MSK:   Moves extremities without difficulty  Other:    Medical Decision Making  Medically screening exam initiated at 5:21 PM.  Appropriate orders placed.  Maxx Calaway was informed that the remainder of the evaluation will be completed by another provider, this initial triage assessment does not replace that evaluation, and the importance of remaining in the ED until their evaluation is complete.     Winnifred Friar, PA-C 10/28/22 1724

## 2022-11-06 ENCOUNTER — Ambulatory Visit: Payer: Medicaid Other

## 2022-11-08 ENCOUNTER — Emergency Department
Admission: EM | Admit: 2022-11-08 | Discharge: 2022-11-08 | Disposition: A | Discharge status: Home / Self Care | Payer: 59 | Attending: Emergency Medicine | Admitting: Emergency Medicine

## 2022-11-08 ENCOUNTER — Emergency Department: Payer: 59

## 2022-11-08 ENCOUNTER — Other Ambulatory Visit: Payer: Self-pay

## 2022-11-08 DIAGNOSIS — O99331 Smoking (tobacco) complicating pregnancy, first trimester: Secondary | ICD-10-CM | POA: Insufficient documentation

## 2022-11-08 DIAGNOSIS — J069 Acute upper respiratory infection, unspecified: Secondary | ICD-10-CM | POA: Insufficient documentation

## 2022-11-08 DIAGNOSIS — Z20822 Contact with and (suspected) exposure to covid-19: Secondary | ICD-10-CM | POA: Insufficient documentation

## 2022-11-08 DIAGNOSIS — J209 Acute bronchitis, unspecified: Secondary | ICD-10-CM | POA: Insufficient documentation

## 2022-11-08 DIAGNOSIS — O99511 Diseases of the respiratory system complicating pregnancy, first trimester: Secondary | ICD-10-CM | POA: Diagnosis not present

## 2022-11-08 DIAGNOSIS — Z3A09 9 weeks gestation of pregnancy: Secondary | ICD-10-CM | POA: Insufficient documentation

## 2022-11-08 DIAGNOSIS — F17200 Nicotine dependence, unspecified, uncomplicated: Secondary | ICD-10-CM | POA: Insufficient documentation

## 2022-11-08 DIAGNOSIS — O26891 Other specified pregnancy related conditions, first trimester: Secondary | ICD-10-CM | POA: Diagnosis present

## 2022-11-08 LAB — RESP PANEL BY RT-PCR (FLU A&B, COVID) ARPGX2
Influenza A by PCR: NEGATIVE
Influenza B by PCR: NEGATIVE
SARS Coronavirus 2 by RT PCR: NEGATIVE

## 2022-11-08 MED ORDER — AEROCHAMBER MV MISC: 0 refills | Status: AC

## 2022-11-08 MED ORDER — ALBUTEROL SULFATE HFA 108 (90 BASE) MCG/ACT IN AERS: 2.0000 | INHALATION_SPRAY | Freq: Four times a day (QID) | RESPIRATORY_TRACT | 2 refills | Status: DC | PRN

## 2022-11-08 MED ORDER — AZITHROMYCIN 250 MG PO TABS: ORAL_TABLET | ORAL | 0 refills | Status: AC

## 2022-11-08 NOTE — ED Triage Notes (Addendum)
Pt reports cough x 5 days with green mucus production. Reports cough is worse at night and pt has noted a wheeze at times. Pt denies hx of asthma. Pt has not been able to go to UC or PCP. Pt denies fevers at home or body aches. Pt reports was here last week for same and tested for flu/COVID with negative results. Pt alert and oriented breathing unlabored speaking in full sentences.   Pt reports she is [redacted] wks pregnant.

## 2022-11-08 NOTE — ED Provider Notes (Signed)
[facility] Provider Note   Event Date/Time   First MD Initiated Contact with Patient 11/08/22 [id number]     (approximate) History  Cough  HPI Vanessa Rivera is a 36 y.o. female with stated past medical history of tobacco abuse and nightly pregnancy who presents complaining of 1 week of cough, sore throat, shortness of breath, and occasional wheezing.  Patient states that she has had symptoms similar to this in the past but they have never lasted for this long.  Patient states she does not have any history of COPD or asthma diagnosed.  Patient did not use any inhalers on a regular basis.  Patient denies any recent travel or sick contacts. ROS: Patient currently denies any vision changes, tinnitus, difficulty speaking, facial droop, chest pain, abdominal pain, nausea/vomiting/diarrhea, dysuria, or weakness/numbness/paresthesias in any extremity   Physical Exam  Triage Vital Signs: ED Triage Vitals  Enc Vitals Group     BP 11/08/22 0626 135/84     Pulse Rate 11/08/22 0626 (!) 102     Resp 11/08/22 0626 18     Temp 11/08/22 0626 98.1 F (36.7 C)     Temp Source 11/08/22 0626 Oral     SpO2 11/08/22 0626 100 %     Weight 11/08/22 0627 200 lb (90.7 kg)     Height 11/08/22 0627 5\' 3"  (1.6 m)     Head Circumference --      Peak Flow --      Pain Score 11/08/22 0627 0     Pain Loc --      Pain Edu? --      Excl. in GC? --    Most recent vital signs: Vitals:   11/08/22 0626  BP: 135/84  Pulse: (!) 102  Resp: 18  Temp: 98.1 F (36.7 C)  SpO2: 100%   General: Awake, oriented x4. CV:  Good peripheral perfusion.  Resp:  Normal effort.  Expiratory wheezes over bilateral lung fields Abd:  Gravid.  Nontender to palpation Other:  Middle-aged gravid Caucasian female laying in bed in no acute distress ED Results / Procedures / Treatments  Labs (all labs ordered are listed, but only abnormal results are displayed) Labs Reviewed  RESP PANEL BY RT-PCR (FLU A&B, COVID)  ARPGX2  RADIOLOGY ED MD interpretation: One-view portable chest x-ray interpreted by me shows no evidence of acute abnormalities including no pneumonia, pneumothorax, or widened mediastinum -Agree with radiology assessment Official radiology report(s): DG Chest Port 1 View  Result Date: 11/08/2022 CLINICAL DATA:  Productive cough. EXAM: PORTABLE CHEST 1 VIEW COMPARISON:  Chest XR, most recently 02/11/2019. FINDINGS: Cardiomediastinal silhouette is within normal limits. Lungs are well inflated. No focal consolidation or mass. No pleural effusion or pneumothorax. No acute displaced fracture. IMPRESSION: No acute cardiopulmonary process Electronically Signed   By: 02/13/2019 M.D.   On: 11/08/2022 07:59   PROCEDURES: Critical Care performed: No .1-3 Lead EKG Interpretation  Performed by: 11/10/2022, MD Authorized by: Merwyn Katos, MD     Interpretation: normal     ECG rate:  98   ECG rate assessment: normal     Rhythm: sinus rhythm     Ectopy: none     Conduction: normal    MEDICATIONS ORDERED IN ED: Medications - No data to display IMPRESSION / MDM / ASSESSMENT AND PLAN / ED COURSE  I reviewed the triage vital signs and the nursing notes.  The patient is on the cardiac monitor to evaluate for evidence of arrhythmia and/or significant heart rate changes. Patient's presentation is most consistent with acute presentation with potential threat to life or bodily function. Otherwise healthy patient presenting with constellation of symptoms likely representing uncomplicated viral upper respiratory symptoms as characterized by mild pharyngitis  Unlikely PTA/RPA: no hot potato voice, no uvular deviation, Unlikely Esophageal rupture: No history of dysphagia Unlikely deep space infection/Ludwigs Low suspicion for CNS infection bacterial sinusitis, or pneumonia given exam and history.  Unlikely Strep or EBV as centor negative and with no pharyngeal exudate,  posterior LAD, or splenomegaly.  Will attempt to alleviate symptoms conservatively; no overt indications at this time for antibiotics. No respiratory distress, otherwise relatively well appearing and nontoxic. Will discuss prompt follow up with PMD and strict return precautions.   FINAL CLINICAL IMPRESSION(S) / ED DIAGNOSES   Final diagnoses:  Acute upper respiratory infection  Acute bronchitis with wheezing   Rx / DC Orders   ED Discharge Orders          Ordered    albuterol (VENTOLIN HFA) 108 (90 Base) MCG/ACT inhaler  Every 6 hours PRN        11/08/22 0809    Spacer/Aero-Holding Chambers (AEROCHAMBER MV) inhaler        11/08/22 0809    azithromycin (ZITHROMAX Z-PAK) 250 MG tablet        11/08/22 0809           Note:  This document was prepared using Dragon voice recognition software and may include unintentional dictation errors.   Merwyn Katos, MD 11/08/22 [phone]

## 2022-11-13 ENCOUNTER — Other Ambulatory Visit: Payer: Medicaid Other

## 2022-11-20 ENCOUNTER — Encounter: Payer: Medicaid Other | Admitting: Obstetrics & Gynecology

## 2022-11-20 ENCOUNTER — Encounter: Payer: Self-pay | Admitting: Certified Nurse Midwife

## 2022-12-01 ENCOUNTER — Emergency Department
Admission: EM | Admit: 2022-12-01 | Discharge: 2022-12-01 | Discharge status: Against Medical Advice | Payer: 59 | Attending: Emergency Medicine | Admitting: Emergency Medicine

## 2022-12-01 DIAGNOSIS — O26899 Other specified pregnancy related conditions, unspecified trimester: Secondary | ICD-10-CM | POA: Insufficient documentation

## 2022-12-01 DIAGNOSIS — M79639 Pain in unspecified forearm: Secondary | ICD-10-CM | POA: Insufficient documentation

## 2022-12-01 DIAGNOSIS — Z5321 Procedure and treatment not carried out due to patient leaving prior to being seen by health care provider: Secondary | ICD-10-CM | POA: Diagnosis not present

## 2022-12-01 LAB — COMPREHENSIVE METABOLIC PANEL
ALT: 11 U/L (ref 0–44)
AST: 15 U/L (ref 15–41)
Albumin: 3.4 g/dL — ABNORMAL LOW (ref 3.5–5.0)
Alkaline Phosphatase: 54 U/L (ref 38–126)
Anion gap: 7 (ref 5–15)
BUN: 10 mg/dL (ref 6–20)
CO2: 25 mmol/L (ref 22–32)
Calcium: 8.5 mg/dL — ABNORMAL LOW (ref 8.9–10.3)
Chloride: 106 mmol/L (ref 98–111)
Creatinine, Ser: 0.62 mg/dL (ref 0.44–1.00)
GFR, Estimated: >60 mL/min (ref >60)
Glucose, Bld: 94 mg/dL (ref 70–99)
Potassium: 3.2 mmol/L — ABNORMAL LOW (ref 3.5–5.1)
Sodium: 138 mmol/L (ref 135–145)
Total Bilirubin: 0.4 mg/dL (ref 0.3–1.2)
Total Protein: 6.9 g/dL (ref 6.5–8.1)

## 2022-12-01 LAB — CBC
HCT: 38.1 % (ref 36.0–46.0)
Hemoglobin: 12.5 g/dL (ref 12.0–15.0)
MCH: 29.8 pg (ref 26.0–34.0)
MCHC: 32.8 g/dL (ref 30.0–36.0)
MCV: 90.9 fL (ref 80.0–100.0)
Platelets: 178 10*3/uL (ref 150–400)
RBC: 4.19 MIL/uL (ref 3.87–5.11)
RDW: 14.4 % (ref 11.5–15.5)
WBC: 9.6 10*3/uL (ref 4.0–10.5)
nRBC: 0 % (ref 0.0–0.2)

## 2022-12-01 LAB — CK: Total CK: 55 U/L (ref 38–234)

## 2022-12-01 NOTE — ED Notes (Signed)
No answer in lobby x3 when called for repeat vitals

## 2022-12-01 NOTE — ED Notes (Signed)
NA x1 for repeat VS

## 2022-12-01 NOTE — ED Notes (Signed)
NA x2 for repeat VS. Pt not visualized in lobby, bathroom, or outside entrance.

## 2022-12-01 NOTE — ED Triage Notes (Signed)
Pt reports bilateral forearm "tightness", denies pain or injury. Pt reports the tightness is constant, denies radiating sensation, denies CP, SOB, dizziness, denies, n/v/d, or abd pain. Pt is currently [redacted] wk pregnant, no complication, follows OB [facility]. VSS, A&O X4, bilateral hand grips strong, pt able to move both extremities w/o difficulties.

## 2022-12-18 NOTE — L&D Delivery Note (Addendum)
Delivery Note  First Stage: Labor onset: 1300 Augmentation : cytotec, pitocin, AROM Analgesia /Anesthesia intrapartum: epidural, IV fentanyl AROM at 1451  Second Stage: Complete dilation at 1856 Onset of pushing at 1858 FHR second stage : reassuring 140-145bpm, minimal to moderate variability, no accelerations, variable decelerations  Delivery of a viable female infant 06/04/2023 at 1920 by Donato Schultz, CNM. delivery of fetal head in OA position with restitution to LOA. No nuchal cord;  Head turtled back and 60 second shoulder dystocia occurred, relieved with McRoberts x2 and suprapubic pressure by Helane Gunther, RN with gentle downward traction. Baby placed on mom's chest, and attended to by peds.  Cord double clamped after cessation of pulsation at of life, cut by friend at bedside Cord blood sample collected   Third Stage: Placenta delivered Duncan intact with 3 VC @ 1940 Placenta disposition: discarded Uterine tone firm / bleeding scant  No laceration identified  Anesthesia for repair: n/a Repair none needed Est. Blood Loss (mL):  Complications: shoulder dystocia  Mom to postpartum.  Baby to Couplet care / Skin to Skin.  Newborn: Birth Weight: 10lb 2oz Apgar Scores: 8, 9 Feeding planned: formula feeding

## 2023-01-11 NOTE — Telephone Encounter (Signed)
This encounter was created in error - please disregard.

## 2023-01-23 ENCOUNTER — Other Ambulatory Visit: Payer: Self-pay | Admitting: Certified Nurse Midwife

## 2023-01-23 DIAGNOSIS — Z3689 Encounter for other specified antenatal screening: Secondary | ICD-10-CM

## 2023-02-13 ENCOUNTER — Telehealth: Payer: Self-pay

## 2023-02-13 NOTE — Telephone Encounter (Signed)
Left a message about patients appointments being moved from Thursday 02/22/23 to Wednesday 02/21/23 Left message for patient to call me back if this will not work for her.

## 2023-02-20 ENCOUNTER — Telehealth: Payer: Self-pay

## 2023-02-21 ENCOUNTER — Ambulatory Visit: Payer: 59 | Attending: Obstetrics

## 2023-02-21 ENCOUNTER — Ambulatory Visit (HOSPITAL_BASED_OUTPATIENT_CLINIC_OR_DEPARTMENT_OTHER): Payer: 59 | Admitting: Obstetrics

## 2023-02-21 ENCOUNTER — Other Ambulatory Visit: Payer: Self-pay

## 2023-02-21 DIAGNOSIS — Z3A24 24 weeks gestation of pregnancy: Secondary | ICD-10-CM | POA: Insufficient documentation

## 2023-02-21 DIAGNOSIS — O09522 Supervision of elderly multigravida, second trimester: Secondary | ICD-10-CM

## 2023-02-21 DIAGNOSIS — O99212 Obesity complicating pregnancy, second trimester: Secondary | ICD-10-CM

## 2023-02-21 DIAGNOSIS — Z363 Encounter for antenatal screening for malformations: Secondary | ICD-10-CM | POA: Insufficient documentation

## 2023-02-21 DIAGNOSIS — O99282 Endocrine, nutritional and metabolic diseases complicating pregnancy, second trimester: Secondary | ICD-10-CM | POA: Diagnosis not present

## 2023-02-21 DIAGNOSIS — E669 Obesity, unspecified: Secondary | ICD-10-CM

## 2023-02-21 DIAGNOSIS — E039 Hypothyroidism, unspecified: Secondary | ICD-10-CM | POA: Insufficient documentation

## 2023-02-21 DIAGNOSIS — Z3689 Encounter for other specified antenatal screening: Secondary | ICD-10-CM

## 2023-02-21 NOTE — Progress Notes (Signed)
MFM Note  Vanessa Rivera was seen for a detailed fetal anatomy scan at 24 weeks 2 days due to advanced maternal age (37 years old), maternal obesity, and hypothyroidism treated with Synthroid 150 mcg daily.  Her most recent TSH drawn 2 weeks ago was elevated (8.2).  This was a slight improvement from her TSH one month ago which was 10.8.  Her free T4 was within normal limits.  She denies any other significant past medical history and denies any problems in her current pregnancy.    She had a cell free DNA test earlier in her pregnancy which indicated a low risk for trisomy 2, 27, and 13. A female fetus is predicted.   She was informed that the fetal growth and amniotic fluid level were appropriate for her gestational age.   The views of the fetal anatomy were limited today due to the fetal position.  The patient was informed that anomalies may be missed due to technical limitations. If the fetus is in a suboptimal position or maternal habitus is increased, visualization of the fetus in the maternal uterus may be impaired.  The following were discussed during today's consultation:  Hypothyroidism in pregnancy  The implications and management of hypothyroidism in pregnancy was discussed.  The association of fetal growth restriction with hypothyroidism in pregnancy was discussed.    She should continue taking Synthroid for the duration of her pregnancy.    She should continue to have her thyroid function tests monitored on a monthly basis to ensure that no further adjustments to her Synthroid dosage are necessary.    We will continue to follow her with monthly growth ultrasounds.  Advanced maternal age and pregnancy  The increased risk of fetal aneuploidy due to advanced maternal age was discussed.   Due to advanced maternal age, the patient was offered and declined an amniocentesis today for definitive diagnosis of fetal aneuploidy.  She is comfortable with her negative cell free DNA  test.  A follow-up exam was scheduled in 4 weeks to assess the fetal growth and to complete the views of the fetal anatomy.    The patient stated that all of her questions were answered today.  A total of 30 minutes was spent counseling and coordinating the care for this patient.  Greater than 50% of the time was spent in direct face-to-face contact.

## 2023-02-22 ENCOUNTER — Ambulatory Visit: Payer: No Typology Code available for payment source

## 2023-03-15 DIAGNOSIS — O99213 Obesity complicating pregnancy, third trimester: Secondary | ICD-10-CM | POA: Insufficient documentation

## 2023-03-19 DIAGNOSIS — E039 Hypothyroidism, unspecified: Secondary | ICD-10-CM | POA: Insufficient documentation

## 2023-03-19 DIAGNOSIS — Z8679 Personal history of other diseases of the circulatory system: Secondary | ICD-10-CM | POA: Insufficient documentation

## 2023-03-25 ENCOUNTER — Emergency Department
Admission: EM | Admit: 2023-03-25 | Discharge: 2023-03-25 | Disposition: A | Discharge status: Home / Self Care | Payer: Medicaid Other | Attending: Emergency Medicine | Admitting: Emergency Medicine

## 2023-03-25 ENCOUNTER — Other Ambulatory Visit: Payer: Self-pay

## 2023-03-25 DIAGNOSIS — R202 Paresthesia of skin: Secondary | ICD-10-CM | POA: Insufficient documentation

## 2023-03-25 DIAGNOSIS — M25511 Pain in right shoulder: Secondary | ICD-10-CM | POA: Diagnosis present

## 2023-03-25 DIAGNOSIS — R200 Anesthesia of skin: Secondary | ICD-10-CM | POA: Diagnosis not present

## 2023-03-25 DIAGNOSIS — M79641 Pain in right hand: Secondary | ICD-10-CM | POA: Insufficient documentation

## 2023-03-25 NOTE — ED Triage Notes (Signed)
Pt to ED for R arm and hand soreness since 3 days. Pain is mostly from right above wrist to upper arm. Hurts to lift arm up. States at times feels tingling to same arm. Is about [redacted] weeks pregnant, EDD 6/24.

## 2023-03-25 NOTE — Discharge Instructions (Signed)
Take Tylenol for pain as needed.  Apply ice to your shoulder.  Follow-up with your doctor if not improving in 5 to 7 days.  If your arm begins to swell please return emergency department for an ultrasound.

## 2023-03-25 NOTE — ED Provider Notes (Signed)
[facility] Provider Note    Event Date/Time   First MD Initiated Contact with Patient 03/25/23 1250     (approximate)   History   Arm Pain   HPI  Vanessa Rivera is a 37 y.o. female with history of thyroid disease and is [redacted] weeks pregnant presents emergency department for right shoulder pain and right hand pain for 3 days.  Patient states hurts to reach overhead.  States her hand had some numbness and tingling when she was driving to the ED today.  No known injury.      Physical Exam   Triage Vital Signs: ED Triage Vitals  Enc Vitals Group     BP 03/25/23 1233 109/70     Pulse Rate 03/25/23 1233 (!) 115     Resp 03/25/23 1233 18     Temp 03/25/23 1233 99 F (37.2 C)     Temp Source 03/25/23 1233 Oral     SpO2 03/25/23 1233 97 %     Weight 03/25/23 1234 236 lb (107 kg)     Height 03/25/23 1234 5\' 3"  (1.6 m)     Head Circumference --      Peak Flow --      Pain Score 03/25/23 1234 6     Pain Loc --      Pain Edu? --      Excl. in GC? --     Most recent vital signs: Vitals:   03/25/23 1233  BP: 109/70  Pulse: (!) 115  Resp: 18  Temp: 99 F (37.2 C)  SpO2: 97%     General: Awake, no distress.   CV:  Good peripheral perfusion. regular rate and  rhythm Resp:  Normal effort.  Abd:  No distention.   Other:  Right shoulder is slightly tender at the joint, pain is reproduced with overhead reach, does have good internal rotation, neurovascular is intact, no swelling noted in the arm   ED Results / Procedures / Treatments   Labs (all labs ordered are listed, but only abnormal results are displayed) Labs Reviewed - No data to display   EKG     RADIOLOGY     PROCEDURES:   Procedures   MEDICATIONS ORDERED IN ED: Medications - No data to display   IMPRESSION / MDM / ASSESSMENT AND PLAN / ED COURSE  I reviewed the triage vital signs and the nursing notes.                              Differential diagnosis includes,  but is not limited to, shoulder pain, tenosynovitis, DVT  Patient's presentation is most consistent with acute, uncomplicated illness.   There is no swelling along the arm do not feel the patient has a DVT, no recent IV etc.  Since the pain is reproduced with movement I do feel this is more of a musculoskeletal problem and most likely due to the pregnancy and fluid retention.  She was given a sling to help with comfort.  Take Tylenol.  Apply ice.  Follow-up with her regular doctor.  Return emergency department worsening.  If her arm begins to swell she is to return the emergency department for ultrasound.  In agreement treatment plan.  Discharged stable condition.      FINAL CLINICAL IMPRESSION(S) / ED DIAGNOSES   Final diagnoses:  Acute pain of right shoulder     Rx / DC Orders   ED  Discharge Orders     None        Note:  This document was prepared using Dragon voice recognition software and may include unintentional dictation errors.    Faythe Ghee, PA-C 03/25/23 1329    Shaune Pollack, MD 03/25/23 1950

## 2023-03-26 ENCOUNTER — Other Ambulatory Visit: Payer: Self-pay

## 2023-03-26 DIAGNOSIS — O09522 Supervision of elderly multigravida, second trimester: Secondary | ICD-10-CM

## 2023-03-26 DIAGNOSIS — O09893 Supervision of other high risk pregnancies, third trimester: Secondary | ICD-10-CM

## 2023-03-26 DIAGNOSIS — F17200 Nicotine dependence, unspecified, uncomplicated: Secondary | ICD-10-CM

## 2023-03-26 DIAGNOSIS — E0590 Thyrotoxicosis, unspecified without thyrotoxic crisis or storm: Secondary | ICD-10-CM

## 2023-03-26 DIAGNOSIS — E039 Hypothyroidism, unspecified: Secondary | ICD-10-CM

## 2023-03-28 ENCOUNTER — Other Ambulatory Visit: Payer: Self-pay

## 2023-03-28 ENCOUNTER — Ambulatory Visit: Payer: Medicaid Other | Attending: Maternal & Fetal Medicine

## 2023-03-28 DIAGNOSIS — O99333 Smoking (tobacco) complicating pregnancy, third trimester: Secondary | ICD-10-CM | POA: Insufficient documentation

## 2023-03-28 DIAGNOSIS — O99283 Endocrine, nutritional and metabolic diseases complicating pregnancy, third trimester: Secondary | ICD-10-CM | POA: Insufficient documentation

## 2023-03-28 DIAGNOSIS — O99213 Obesity complicating pregnancy, third trimester: Secondary | ICD-10-CM | POA: Diagnosis not present

## 2023-03-28 DIAGNOSIS — F17200 Nicotine dependence, unspecified, uncomplicated: Secondary | ICD-10-CM | POA: Diagnosis not present

## 2023-03-28 DIAGNOSIS — Z3A29 29 weeks gestation of pregnancy: Secondary | ICD-10-CM | POA: Insufficient documentation

## 2023-03-28 DIAGNOSIS — E669 Obesity, unspecified: Secondary | ICD-10-CM

## 2023-03-28 DIAGNOSIS — E039 Hypothyroidism, unspecified: Secondary | ICD-10-CM | POA: Diagnosis not present

## 2023-03-28 DIAGNOSIS — O09893 Supervision of other high risk pregnancies, third trimester: Secondary | ICD-10-CM | POA: Insufficient documentation

## 2023-03-28 DIAGNOSIS — E0590 Thyrotoxicosis, unspecified without thyrotoxic crisis or storm: Secondary | ICD-10-CM | POA: Insufficient documentation

## 2023-03-28 DIAGNOSIS — O09522 Supervision of elderly multigravida, second trimester: Secondary | ICD-10-CM

## 2023-03-28 DIAGNOSIS — O09523 Supervision of elderly multigravida, third trimester: Secondary | ICD-10-CM | POA: Diagnosis not present

## 2023-04-23 ENCOUNTER — Other Ambulatory Visit: Payer: Self-pay

## 2023-04-23 DIAGNOSIS — E0590 Thyrotoxicosis, unspecified without thyrotoxic crisis or storm: Secondary | ICD-10-CM

## 2023-04-23 DIAGNOSIS — O10913 Unspecified pre-existing hypertension complicating pregnancy, third trimester: Secondary | ICD-10-CM

## 2023-04-23 DIAGNOSIS — O09523 Supervision of elderly multigravida, third trimester: Secondary | ICD-10-CM

## 2023-04-23 DIAGNOSIS — O99213 Obesity complicating pregnancy, third trimester: Secondary | ICD-10-CM

## 2023-04-23 DIAGNOSIS — O09522 Supervision of elderly multigravida, second trimester: Secondary | ICD-10-CM

## 2023-04-25 ENCOUNTER — Ambulatory Visit: Payer: Medicaid Other

## 2023-04-25 ENCOUNTER — Other Ambulatory Visit: Payer: Self-pay

## 2023-04-25 ENCOUNTER — Ambulatory Visit: Payer: Medicaid Other | Attending: Maternal & Fetal Medicine

## 2023-04-25 DIAGNOSIS — O09523 Supervision of elderly multigravida, third trimester: Secondary | ICD-10-CM | POA: Diagnosis not present

## 2023-04-25 DIAGNOSIS — O10013 Pre-existing essential hypertension complicating pregnancy, third trimester: Secondary | ICD-10-CM | POA: Insufficient documentation

## 2023-04-25 DIAGNOSIS — O10913 Unspecified pre-existing hypertension complicating pregnancy, third trimester: Secondary | ICD-10-CM

## 2023-04-25 DIAGNOSIS — O99283 Endocrine, nutritional and metabolic diseases complicating pregnancy, third trimester: Secondary | ICD-10-CM | POA: Diagnosis not present

## 2023-04-25 DIAGNOSIS — E669 Obesity, unspecified: Secondary | ICD-10-CM

## 2023-04-25 DIAGNOSIS — E039 Hypothyroidism, unspecified: Secondary | ICD-10-CM | POA: Diagnosis not present

## 2023-04-25 DIAGNOSIS — Z3A33 33 weeks gestation of pregnancy: Secondary | ICD-10-CM | POA: Diagnosis not present

## 2023-04-25 DIAGNOSIS — E0590 Thyrotoxicosis, unspecified without thyrotoxic crisis or storm: Secondary | ICD-10-CM

## 2023-04-25 DIAGNOSIS — O99213 Obesity complicating pregnancy, third trimester: Secondary | ICD-10-CM | POA: Diagnosis not present

## 2023-05-01 ENCOUNTER — Other Ambulatory Visit: Payer: Self-pay

## 2023-05-01 DIAGNOSIS — E039 Hypothyroidism, unspecified: Secondary | ICD-10-CM

## 2023-05-01 DIAGNOSIS — O99213 Obesity complicating pregnancy, third trimester: Secondary | ICD-10-CM

## 2023-05-01 DIAGNOSIS — O10913 Unspecified pre-existing hypertension complicating pregnancy, third trimester: Secondary | ICD-10-CM

## 2023-05-01 DIAGNOSIS — O09522 Supervision of elderly multigravida, second trimester: Secondary | ICD-10-CM

## 2023-05-02 ENCOUNTER — Other Ambulatory Visit: Payer: Self-pay | Admitting: Obstetrics

## 2023-05-02 ENCOUNTER — Encounter: Payer: Self-pay | Admitting: Obstetrics

## 2023-05-02 ENCOUNTER — Ambulatory Visit: Payer: Medicaid Other | Attending: Obstetrics

## 2023-05-02 ENCOUNTER — Ambulatory Visit: Payer: Medicaid Other

## 2023-05-02 ENCOUNTER — Other Ambulatory Visit: Payer: Self-pay

## 2023-05-02 VITALS — BP 114/75 | HR 110 | Temp 98.1°F | Ht 63.0 in | Wt 262.0 lb

## 2023-05-02 DIAGNOSIS — O09522 Supervision of elderly multigravida, second trimester: Secondary | ICD-10-CM

## 2023-05-02 DIAGNOSIS — O99213 Obesity complicating pregnancy, third trimester: Secondary | ICD-10-CM | POA: Diagnosis present

## 2023-05-02 DIAGNOSIS — O09523 Supervision of elderly multigravida, third trimester: Secondary | ICD-10-CM | POA: Diagnosis not present

## 2023-05-02 DIAGNOSIS — Z3A34 34 weeks gestation of pregnancy: Secondary | ICD-10-CM | POA: Insufficient documentation

## 2023-05-02 DIAGNOSIS — E039 Hypothyroidism, unspecified: Secondary | ICD-10-CM | POA: Diagnosis not present

## 2023-05-02 DIAGNOSIS — O99283 Endocrine, nutritional and metabolic diseases complicating pregnancy, third trimester: Secondary | ICD-10-CM | POA: Insufficient documentation

## 2023-05-02 DIAGNOSIS — O10913 Unspecified pre-existing hypertension complicating pregnancy, third trimester: Secondary | ICD-10-CM

## 2023-05-02 DIAGNOSIS — E669 Obesity, unspecified: Secondary | ICD-10-CM

## 2023-05-02 NOTE — Procedures (Addendum)
Vanessa Rivera 10-Jun-1986 [redacted]w[redacted]d  Fetus A Non-Stress Test Interpretation for 05/02/23  Indication:  Thyroid Disease, Obesity, AMA  Fetal Heart Rate A Mode: External Baseline Rate (A): 135 bpm Variability: Moderate Accelerations: 15 x 15 Decelerations: None Multiple birth?: No  Uterine Activity Mode: Toco  Interpretation (Fetal Testing) Nonstress Test Interpretation: Reactive (Dr. Parke Poisson interpreted NST)

## 2023-05-09 ENCOUNTER — Ambulatory Visit: Payer: Medicaid Other | Attending: Maternal & Fetal Medicine

## 2023-05-09 ENCOUNTER — Other Ambulatory Visit: Payer: Self-pay

## 2023-05-09 VITALS — BP 114/68 | HR 108 | Temp 97.9°F | Ht 63.0 in | Wt 263.5 lb

## 2023-05-09 DIAGNOSIS — O99283 Endocrine, nutritional and metabolic diseases complicating pregnancy, third trimester: Secondary | ICD-10-CM

## 2023-05-09 DIAGNOSIS — O09522 Supervision of elderly multigravida, second trimester: Secondary | ICD-10-CM

## 2023-05-09 DIAGNOSIS — Z362 Encounter for other antenatal screening follow-up: Secondary | ICD-10-CM | POA: Diagnosis not present

## 2023-05-09 DIAGNOSIS — O09523 Supervision of elderly multigravida, third trimester: Secondary | ICD-10-CM | POA: Insufficient documentation

## 2023-05-09 DIAGNOSIS — Z3A35 35 weeks gestation of pregnancy: Secondary | ICD-10-CM | POA: Insufficient documentation

## 2023-05-09 DIAGNOSIS — E039 Hypothyroidism, unspecified: Secondary | ICD-10-CM

## 2023-05-09 DIAGNOSIS — E669 Obesity, unspecified: Secondary | ICD-10-CM

## 2023-05-09 DIAGNOSIS — E0590 Thyrotoxicosis, unspecified without thyrotoxic crisis or storm: Secondary | ICD-10-CM

## 2023-05-09 DIAGNOSIS — O99213 Obesity complicating pregnancy, third trimester: Secondary | ICD-10-CM | POA: Insufficient documentation

## 2023-05-16 ENCOUNTER — Ambulatory Visit: Payer: Medicaid Other | Attending: Obstetrics and Gynecology | Admitting: Obstetrics and Gynecology

## 2023-05-16 ENCOUNTER — Encounter: Payer: Self-pay | Admitting: Obstetrics and Gynecology

## 2023-05-16 ENCOUNTER — Other Ambulatory Visit: Payer: Self-pay

## 2023-05-16 VITALS — BP 110/72 | HR 109 | Temp 97.9°F

## 2023-05-16 DIAGNOSIS — O99283 Endocrine, nutritional and metabolic diseases complicating pregnancy, third trimester: Secondary | ICD-10-CM | POA: Diagnosis not present

## 2023-05-16 DIAGNOSIS — E039 Hypothyroidism, unspecified: Secondary | ICD-10-CM

## 2023-05-16 DIAGNOSIS — Z3A36 36 weeks gestation of pregnancy: Secondary | ICD-10-CM

## 2023-05-16 NOTE — Procedures (Signed)
NST: Reactive Baseline 125 Variability: Moderate Decels: None Accels: 15x15  Contractions: 2 in 20 min Duration 50-70 sec Strength: Mild Resting tone: Adequate Pt denies feeling contractions

## 2023-05-16 NOTE — Progress Notes (Signed)
Pt presents for NST, pt denies leaking of fluid or vaginal bleeding. Contractions observed on efm but pt denies feeling them. NST reactive. Reviewed by Dr. Judeth Cornfield, pt d/c home to f/u next week for NST.

## 2023-05-22 ENCOUNTER — Other Ambulatory Visit: Payer: Self-pay | Admitting: Obstetrics and Gynecology

## 2023-05-22 DIAGNOSIS — O09523 Supervision of elderly multigravida, third trimester: Secondary | ICD-10-CM

## 2023-05-23 ENCOUNTER — Other Ambulatory Visit: Payer: Self-pay

## 2023-05-23 DIAGNOSIS — O99213 Obesity complicating pregnancy, third trimester: Secondary | ICD-10-CM

## 2023-05-23 DIAGNOSIS — O10913 Unspecified pre-existing hypertension complicating pregnancy, third trimester: Secondary | ICD-10-CM

## 2023-05-23 DIAGNOSIS — E0590 Thyrotoxicosis, unspecified without thyrotoxic crisis or storm: Secondary | ICD-10-CM

## 2023-05-23 DIAGNOSIS — O09522 Supervision of elderly multigravida, second trimester: Secondary | ICD-10-CM

## 2023-05-28 ENCOUNTER — Ambulatory Visit: Payer: Medicaid Other | Attending: Obstetrics and Gynecology

## 2023-05-28 ENCOUNTER — Other Ambulatory Visit: Payer: Self-pay | Admitting: Obstetrics and Gynecology

## 2023-05-28 ENCOUNTER — Other Ambulatory Visit: Payer: Self-pay

## 2023-05-28 ENCOUNTER — Ambulatory Visit: Payer: Medicaid Other | Admitting: Obstetrics and Gynecology

## 2023-05-28 DIAGNOSIS — O99213 Obesity complicating pregnancy, third trimester: Secondary | ICD-10-CM | POA: Insufficient documentation

## 2023-05-28 DIAGNOSIS — O09522 Supervision of elderly multigravida, second trimester: Secondary | ICD-10-CM

## 2023-05-28 DIAGNOSIS — O09523 Supervision of elderly multigravida, third trimester: Secondary | ICD-10-CM | POA: Insufficient documentation

## 2023-05-28 DIAGNOSIS — E0590 Thyrotoxicosis, unspecified without thyrotoxic crisis or storm: Secondary | ICD-10-CM

## 2023-05-28 DIAGNOSIS — Z3A38 38 weeks gestation of pregnancy: Secondary | ICD-10-CM

## 2023-05-28 DIAGNOSIS — Z362 Encounter for other antenatal screening follow-up: Secondary | ICD-10-CM | POA: Diagnosis not present

## 2023-05-28 DIAGNOSIS — O10913 Unspecified pre-existing hypertension complicating pregnancy, third trimester: Secondary | ICD-10-CM

## 2023-05-28 DIAGNOSIS — E039 Hypothyroidism, unspecified: Secondary | ICD-10-CM | POA: Diagnosis present

## 2023-05-28 DIAGNOSIS — O99283 Endocrine, nutritional and metabolic diseases complicating pregnancy, third trimester: Secondary | ICD-10-CM | POA: Diagnosis present

## 2023-05-28 DIAGNOSIS — E669 Obesity, unspecified: Secondary | ICD-10-CM

## 2023-05-28 DIAGNOSIS — E668 Other obesity: Secondary | ICD-10-CM

## 2023-05-28 NOTE — Procedures (Signed)
Lunette Tapp 28-Jul-1986 [redacted]w[redacted]d  Fetus A Non-Stress Test Interpretation for 05/28/23  Indication:  Thyroid Disease , Weight Gain +100 pounds  Fetal Heart Rate A Mode: External Baseline Rate (A): 150 bpm Variability: Moderate Accelerations: 15 x 15 Decelerations: None Multiple birth?: No  Uterine Activity Mode: Toco  Interpretation (Fetal Testing) Nonstress Test Interpretation: Reactive (Dr. Judeth Cornfield interpreted NST)

## 2023-06-04 ENCOUNTER — Other Ambulatory Visit: Payer: Self-pay

## 2023-06-04 ENCOUNTER — Encounter: Payer: Self-pay | Admitting: Obstetrics and Gynecology

## 2023-06-04 ENCOUNTER — Inpatient Hospital Stay: Payer: Medicaid Other | Admitting: Anesthesiology

## 2023-06-04 ENCOUNTER — Inpatient Hospital Stay
Admission: EM | Admit: 2023-06-04 | Discharge: 2023-06-05 | DRG: 807 | Disposition: A | Discharge status: Home / Self Care | Payer: Medicaid Other | Attending: Obstetrics | Admitting: Obstetrics

## 2023-06-04 DIAGNOSIS — O99214 Obesity complicating childbirth: Secondary | ICD-10-CM | POA: Diagnosis present

## 2023-06-04 DIAGNOSIS — Z7982 Long term (current) use of aspirin: Secondary | ICD-10-CM | POA: Diagnosis not present

## 2023-06-04 DIAGNOSIS — F419 Anxiety disorder, unspecified: Secondary | ICD-10-CM | POA: Diagnosis present

## 2023-06-04 DIAGNOSIS — O1092 Unspecified pre-existing hypertension complicating childbirth: Secondary | ICD-10-CM | POA: Diagnosis present

## 2023-06-04 DIAGNOSIS — Z79890 Hormone replacement therapy (postmenopausal): Secondary | ICD-10-CM | POA: Diagnosis not present

## 2023-06-04 DIAGNOSIS — Z3A39 39 weeks gestation of pregnancy: Secondary | ICD-10-CM

## 2023-06-04 DIAGNOSIS — Z880 Allergy status to penicillin: Secondary | ICD-10-CM

## 2023-06-04 DIAGNOSIS — O99334 Smoking (tobacco) complicating childbirth: Secondary | ICD-10-CM | POA: Diagnosis present

## 2023-06-04 DIAGNOSIS — O09523 Supervision of elderly multigravida, third trimester: Secondary | ICD-10-CM | POA: Diagnosis present

## 2023-06-04 DIAGNOSIS — Z8249 Family history of ischemic heart disease and other diseases of the circulatory system: Secondary | ICD-10-CM | POA: Diagnosis not present

## 2023-06-04 DIAGNOSIS — O3660X0 Maternal care for excessive fetal growth, unspecified trimester, not applicable or unspecified: Secondary | ICD-10-CM

## 2023-06-04 DIAGNOSIS — F17210 Nicotine dependence, cigarettes, uncomplicated: Secondary | ICD-10-CM | POA: Diagnosis present

## 2023-06-04 DIAGNOSIS — O3663X0 Maternal care for excessive fetal growth, third trimester, not applicable or unspecified: Secondary | ICD-10-CM | POA: Diagnosis present

## 2023-06-04 LAB — URINE DRUG SCREEN, QUALITATIVE (ARMC ONLY)
Amphetamines, Ur Screen: NOT DETECTED
Barbiturates, Ur Screen: NOT DETECTED
Benzodiazepine, Ur Scrn: NOT DETECTED
Cannabinoid 50 Ng, Ur ~~LOC~~: NOT DETECTED
Cocaine Metabolite,Ur ~~LOC~~: NOT DETECTED
MDMA (Ecstasy)Ur Screen: NOT DETECTED
Methadone Scn, Ur: NOT DETECTED
Opiate, Ur Screen: NOT DETECTED
Phencyclidine (PCP) Ur S: NOT DETECTED
Tricyclic, Ur Screen: NOT DETECTED

## 2023-06-04 LAB — PROTEIN / CREATININE RATIO, URINE
Creatinine, Urine: 125 mg/dL
Protein Creatinine Ratio: 0.17 mg/mg{Cre} — ABNORMAL HIGH (ref 0.00–0.15)
Total Protein, Urine: 21 mg/dL

## 2023-06-04 LAB — CBC
HCT: 38.2 % (ref 36.0–46.0)
Hemoglobin: 12.9 g/dL (ref 12.0–15.0)
MCH: 31.4 pg (ref 26.0–34.0)
MCHC: 33.8 g/dL (ref 30.0–36.0)
MCV: 92.9 fL (ref 80.0–100.0)
Platelets: 205 10*3/uL (ref 150–400)
RBC: 4.11 MIL/uL (ref 3.87–5.11)
RDW: 13.9 % (ref 11.5–15.5)
WBC: 9.7 10*3/uL (ref 4.0–10.5)
nRBC: 0 % (ref 0.0–0.2)

## 2023-06-04 LAB — TYPE AND SCREEN
ABO/RH(D): O POS
Antibody Screen: NEGATIVE

## 2023-06-04 LAB — RPR: RPR Ser Ql: NONREACTIVE

## 2023-06-04 MED ORDER — ACETAMINOPHEN 325 MG PO TABS
650.0000 mg | ORAL_TABLET | ORAL | Status: DC | PRN
  Administered 2023-06-04: 650 mg via ORAL
  Filled 2023-06-04: qty 2

## 2023-06-04 MED ORDER — LACTATED RINGERS IV SOLN: INTRAVENOUS | Status: DC

## 2023-06-04 MED ORDER — OXYTOCIN BOLUS FROM INFUSION: 333.0000 mL | Freq: Once | INTRAVENOUS | Status: DC

## 2023-06-04 MED ORDER — OXYTOCIN 10 UNIT/ML IJ SOLN
INTRAMUSCULAR | Status: AC
  Administered 2023-06-04: 10 [IU]
  Filled 2023-06-04: qty 2

## 2023-06-04 MED ORDER — SENNOSIDES-DOCUSATE SODIUM 8.6-50 MG PO TABS
2.0000 | ORAL_TABLET | Freq: Every day | ORAL | Status: DC
  Administered 2023-06-05: 2 via ORAL
  Filled 2023-06-04: qty 2

## 2023-06-04 MED ORDER — LIDOCAINE HCL (PF) 1 % IJ SOLN
INTRAMUSCULAR | Status: DC | PRN
  Administered 2023-06-04: 3 mL via SUBCUTANEOUS

## 2023-06-04 MED ORDER — ONDANSETRON HCL 4 MG/2ML IJ SOLN: 4.0000 mg | Freq: Four times a day (QID) | INTRAMUSCULAR | Status: DC | PRN

## 2023-06-04 MED ORDER — BENZOCAINE-MENTHOL 20-0.5 % EX AERO
1.0000 | INHALATION_SPRAY | CUTANEOUS | Status: DC | PRN
  Filled 2023-06-04: qty 56

## 2023-06-04 MED ORDER — DIBUCAINE (PERIANAL) 1 % EX OINT
1.0000 | TOPICAL_OINTMENT | CUTANEOUS | Status: DC | PRN
  Filled 2023-06-04: qty 28

## 2023-06-04 MED ORDER — SIMETHICONE 80 MG PO CHEW: 80.0000 mg | CHEWABLE_TABLET | ORAL | Status: DC | PRN

## 2023-06-04 MED ORDER — EPHEDRINE 5 MG/ML INJ: 10.0000 mg | INTRAVENOUS | Status: DC | PRN

## 2023-06-04 MED ORDER — IBUPROFEN 600 MG PO TABS
ORAL_TABLET | ORAL | Status: AC
  Administered 2023-06-04: 600 mg via ORAL
  Filled 2023-06-04: qty 1

## 2023-06-04 MED ORDER — MISOPROSTOL 25 MCG QUARTER TABLET
25.0000 ug | ORAL_TABLET | ORAL | Status: DC
  Administered 2023-06-04: 25 ug via ORAL
  Filled 2023-06-04: qty 1

## 2023-06-04 MED ORDER — FENTANYL CITRATE (PF) 100 MCG/2ML IJ SOLN
50.0000 ug | INTRAMUSCULAR | Status: DC | PRN
  Administered 2023-06-04: 100 ug via INTRAVENOUS
  Filled 2023-06-04: qty 2

## 2023-06-04 MED ORDER — ZOLPIDEM TARTRATE 5 MG PO TABS: 5.0000 mg | ORAL_TABLET | Freq: Every evening | ORAL | Status: DC | PRN

## 2023-06-04 MED ORDER — DIPHENHYDRAMINE HCL 25 MG PO CAPS: 25.0000 mg | ORAL_CAPSULE | Freq: Four times a day (QID) | ORAL | Status: DC | PRN

## 2023-06-04 MED ORDER — OXYCODONE-ACETAMINOPHEN 5-325 MG PO TABS: 1.0000 | ORAL_TABLET | ORAL | Status: DC | PRN

## 2023-06-04 MED ORDER — ACETAMINOPHEN 325 MG PO TABS: 650.0000 mg | ORAL_TABLET | ORAL | Status: DC | PRN

## 2023-06-04 MED ORDER — TETANUS-DIPHTH-ACELL PERTUSSIS 5-2.5-18.5 LF-MCG/0.5 IM SUSY: 0.5000 mL | PREFILLED_SYRINGE | Freq: Once | INTRAMUSCULAR | Status: DC

## 2023-06-04 MED ORDER — SOD CITRATE-CITRIC ACID 500-334 MG/5ML PO SOLN: 30.0000 mL | ORAL | Status: DC | PRN

## 2023-06-04 MED ORDER — LIDOCAINE HCL (PF) 1 % IJ SOLN: 30.0000 mL | INTRAMUSCULAR | Status: DC | PRN

## 2023-06-04 MED ORDER — COCONUT OIL OIL: 1.0000 | TOPICAL_OIL | Status: DC | PRN

## 2023-06-04 MED ORDER — FENTANYL-BUPIVACAINE-NACL 0.5-0.125-0.9 MG/250ML-% EP SOLN
EPIDURAL | Status: AC
  Filled 2023-06-04: qty 250

## 2023-06-04 MED ORDER — PHENYLEPHRINE 80 MCG/ML (10ML) SYRINGE FOR IV PUSH (FOR BLOOD PRESSURE SUPPORT): 80.0000 ug | PREFILLED_SYRINGE | INTRAVENOUS | Status: DC | PRN

## 2023-06-04 MED ORDER — DIPHENHYDRAMINE HCL 50 MG/ML IJ SOLN: 12.5000 mg | INTRAMUSCULAR | Status: DC | PRN

## 2023-06-04 MED ORDER — OXYCODONE-ACETAMINOPHEN 5-325 MG PO TABS: 2.0000 | ORAL_TABLET | ORAL | Status: DC | PRN

## 2023-06-04 MED ORDER — SODIUM CHLORIDE 0.9 % IV SOLN
INTRAVENOUS | Status: DC | PRN
  Administered 2023-06-04: 5 mL via EPIDURAL
  Administered 2023-06-04: 4 mL via EPIDURAL

## 2023-06-04 MED ORDER — LEVOTHYROXINE SODIUM 125 MCG PO TABS
125.0000 ug | ORAL_TABLET | Freq: Every day | ORAL | Status: DC
  Administered 2023-06-05: 125 ug via ORAL
  Filled 2023-06-04: qty 1

## 2023-06-04 MED ORDER — LIDOCAINE HCL (PF) 1 % IJ SOLN
INTRAMUSCULAR | Status: AC
  Filled 2023-06-04: qty 30

## 2023-06-04 MED ORDER — MISOPROSTOL 200 MCG PO TABS
ORAL_TABLET | ORAL | Status: AC
  Filled 2023-06-04: qty 4

## 2023-06-04 MED ORDER — ONDANSETRON HCL 4 MG/2ML IJ SOLN: 4.0000 mg | INTRAMUSCULAR | Status: DC | PRN

## 2023-06-04 MED ORDER — AMMONIA AROMATIC IN INHA
RESPIRATORY_TRACT | Status: AC
  Filled 2023-06-04: qty 10

## 2023-06-04 MED ORDER — MISOPROSTOL 25 MCG QUARTER TABLET
25.0000 ug | ORAL_TABLET | ORAL | Status: DC
  Administered 2023-06-04: 25 ug via VAGINAL
  Filled 2023-06-04: qty 1

## 2023-06-04 MED ORDER — ONDANSETRON HCL 4 MG PO TABS: 4.0000 mg | ORAL_TABLET | ORAL | Status: DC | PRN

## 2023-06-04 MED ORDER — TERBUTALINE SULFATE 1 MG/ML IJ SOLN: 0.2500 mg | Freq: Once | INTRAMUSCULAR | Status: DC | PRN

## 2023-06-04 MED ORDER — LACTATED RINGERS IV SOLN: 500.0000 mL | Freq: Once | INTRAVENOUS | Status: DC

## 2023-06-04 MED ORDER — FENTANYL-BUPIVACAINE-NACL 0.5-0.125-0.9 MG/250ML-% EP SOLN
12.0000 mL/h | EPIDURAL | Status: DC | PRN
  Administered 2023-06-04: 12 mL/h via EPIDURAL

## 2023-06-04 MED ORDER — PRENATAL MULTIVITAMIN CH
1.0000 | ORAL_TABLET | Freq: Every day | ORAL | Status: DC
  Administered 2023-06-05: 1 via ORAL
  Filled 2023-06-04: qty 1

## 2023-06-04 MED ORDER — WITCH HAZEL-GLYCERIN EX PADS
1.0000 | MEDICATED_PAD | CUTANEOUS | Status: DC | PRN
  Filled 2023-06-04: qty 100

## 2023-06-04 MED ORDER — OXYTOCIN-SODIUM CHLORIDE 30-0.9 UT/500ML-% IV SOLN: 2.5000 [IU]/h | INTRAVENOUS | Status: DC

## 2023-06-04 MED ORDER — IBUPROFEN 600 MG PO TABS
600.0000 mg | ORAL_TABLET | Freq: Four times a day (QID) | ORAL | Status: DC
  Administered 2023-06-05 (×3): 600 mg via ORAL
  Filled 2023-06-04 (×3): qty 1

## 2023-06-04 MED ORDER — LEVOTHYROXINE SODIUM 125 MCG PO TABS: 250.0000 ug | ORAL_TABLET | Freq: Every day | ORAL | Status: DC

## 2023-06-04 MED ORDER — ALBUTEROL SULFATE (2.5 MG/3ML) 0.083% IN NEBU
2.5000 mg | INHALATION_SOLUTION | Freq: Four times a day (QID) | RESPIRATORY_TRACT | Status: DC | PRN
  Administered 2023-06-05: 2.5 mg via RESPIRATORY_TRACT
  Filled 2023-06-04: qty 3

## 2023-06-04 MED ORDER — LACTATED RINGERS IV SOLN: 500.0000 mL | INTRAVENOUS | Status: DC | PRN

## 2023-06-04 MED ORDER — OXYTOCIN-SODIUM CHLORIDE 30-0.9 UT/500ML-% IV SOLN
1.0000 m[IU]/min | INTRAVENOUS | Status: DC
  Administered 2023-06-04: 2 m[IU]/min via INTRAVENOUS
  Filled 2023-06-04: qty 500

## 2023-06-04 MED ORDER — LIDOCAINE-EPINEPHRINE (PF) 1.5 %-1:200000 IJ SOLN
INTRAMUSCULAR | Status: DC | PRN
  Administered 2023-06-04: 4 mL via EPIDURAL

## 2023-06-04 NOTE — Anesthesia Preprocedure Evaluation (Signed)
Anesthesia Evaluation  Patient identified by MRN, date of birth, ID band Patient awake    Reviewed: Allergy & Precautions, H&P , NPO status , Patient's Chart, lab work & pertinent test results, reviewed documented beta blocker date and time   Airway Mallampati: II  TM Distance: >3 FB Neck ROM: full    Dental no notable dental hx. (+) Poor Dentition   Pulmonary neg pulmonary ROS, Current Smoker   Pulmonary exam normal breath sounds clear to auscultation       Cardiovascular Exercise Tolerance: Good negative cardio ROS Normal cardiovascular exam Rhythm:regular Rate:Normal     Neuro/Psych  Headaches  Anxiety Depression     negative psych ROS   GI/Hepatic negative GI ROS, Neg liver ROS,,,  Endo/Other  Hypothyroidism Hyperthyroidism Morbid obesity  Renal/GU negative Renal ROS  negative genitourinary   Musculoskeletal   Abdominal   Peds  Hematology negative hematology ROS (+)   Anesthesia Other Findings Past Medical History: No date: Amphetamine use disorder, moderate (HCC) No date: Anxiety and depression 04/07/2019: Fetal tachycardia affecting care of mother, delivered No date: Goiter No date: Hyperthyroidism 01/05/2005: Migraine with aura 01/05/2005: Migraine with aura 04/07/2019: Normal vaginal delivery 04/07/2019: Placental abruption affecting delivery 04/07/2019: Postpartum care following vaginal delivery 04/07/2019: Preterm premature rupture of membranes (PPROM) delivered,  current hospitalization 04/07/2019: Tachycardia with greater than 160 beats per minute No date: Thyroid disease No date: Thyroiditis No date: Tobacco use affecting pregnancy in third trimester,  antepartum 04/06/2019: Vaginal bleeding during pregnancy History reviewed. No pertinent surgical history. BMI    Body Mass Index: 48.70 kg/m     Reproductive/Obstetrics negative OB ROS                              Anesthesia Physical Anesthesia Plan  ASA: 3  Anesthesia Plan: General   Post-op Pain Management:    Induction:   PONV Risk Score and Plan:   Airway Management Planned:   Additional Equipment:   Intra-op Plan:   Post-operative Plan:   Informed Consent: I have reviewed the patients History and Physical, chart, labs and discussed the procedure including the risks, benefits and alternatives for the proposed anesthesia with the patient or authorized representative who has indicated his/her understanding and acceptance.     Dental Advisory Given  Plan Discussed with: CRNA  Anesthesia Plan Comments:        Anesthesia Quick Evaluation

## 2023-06-04 NOTE — Discharge Summary (Signed)
Obstetrical Discharge Summary  Patient Name: Vanessa Rivera DOB: 1986-07-26 MRN: [id number]  Date of Admission: 06/04/2023 Date of Delivery: 06/04/23 Delivered by: Donato Schultz, CNM Date of Discharge: 06/05/2023  Primary OB: [facility]  WUJ:[id number] last menstrual period was 09/04/2022 (approximate). EDC Estimated Date of Delivery: 06/11/23 Gestational Age at Delivery: [redacted]w[redacted]d   Antepartum complications:  1. Chronic HTN (no medications) 2. Fetal macrosomia (EFW on 05/28/23 9lb 9oz, she has a proven pelvis to 9lb 15oz) 3. Hx of shoulder dystocia with 9lb 15oz baby 4. Anxiety 5. Smoking 6. AMA 7. 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 Obesity Admitting Diagnosis: IOL for fetal macrosomia and chronic HTN Secondary Diagnosis: NSVD, shoulder dystocia Patient Active Problem List   Diagnosis Date Noted   Advanced maternal age in multigravida, third trimester 06/04/2023   Shoulder dystocia during labor and delivery 06/04/2023   Fetal macrosomia affecting management of mother, antepartum 06/04/2023   NSVD (normal spontaneous vaginal delivery) 06/04/2023   Hypothyroid in pregnancy, antepartum 03/19/2023   History of hypertension 03/19/2023   Obesity affecting pregnancy, antepartum, third trimester 03/15/2023   Abnormal Pap smear of cervix 10/09/19 neg +HPV 10/16/2019   Smoker 1-1 1/2 ppd 10/09/2019   Anxiety and depression 09/25/2018   High-risk pregnancy 09/25/2018   History of preterm delivery 09/25/2018   History of shoulder dystocia in prior pregnancy 09/25/2018   Hyperthyroidism 10/08/2017   Lymphadenopathy 10/08/2017   Depression 09/07/2015    Augmentation: AROM, Pitocin, and Cytotec Complications: None Intrapartum complications/course: She arrived for IOL and was 1.5cm. She received one dose of cytotec then pitocin then AROM. She progressed to 10/100/0 and pushed , had a 60 second shoulder dystocia relieved with McRoberts and suprapubic pressure and gentle  downward traction, then delivery of viable female infant. Apgars 8/9. Intact perineum. Date of Delivery: 06/04/23 Delivered By: Donato Schultz, CNM Delivery Type: spontaneous vaginal delivery Anesthesia: epidural Placenta: spontaneous Laceration: none Episiotomy: none Newborn Data: Live born female "Rose" Birth Weight:  10lb 2oz APGAR: 8, 9  Newborn Delivery   Time head delivered: 06/04/2023 19:29:00 Birth date/time: 06/04/2023 19:20:00 Delivery type: Vaginal, Spontaneous        Postpartum Procedures:  Edinburgh:     06/05/2023    8:51 AM 04/07/2019   12:00 PM  Edinburgh Postnatal Depression Scale Screening Tool  I have been able to laugh and see the funny side of things. 0 0  I have looked forward with enjoyment to things. 0 0  I have blamed myself unnecessarily when things went wrong. 3 1  I have been anxious or worried for no good reason. 0 2  I have felt scared or panicky for no good reason. 1 1  Things have been getting on top of me. 3 1  I have been so unhappy that I have had difficulty sleeping. 1 1  I have felt sad or miserable. 1 1  I have been so unhappy that I have been crying. 3 1  The thought of harming myself has occurred to me. 3 0  Edinburgh Postnatal Depression Scale Total 15 8      Post partum course:  Patient had an uncomplicated postpartum course.  By time of discharge on PPD#1, her pain was controlled on oral pain medications; she had appropriate lochia and was ambulating, voiding without difficulty and tolerating regular diet.  She was deemed stable for discharge to home.    Discharge Physical Exam:  BP 119/76 (BP Location: Right Arm)   Pulse 83   Temp (!) 97.4  F (36.3 C) (Oral)   Resp 18   Ht 5\' 3"  (1.6 m)   Wt 124.7 kg   LMP 09/04/2022 (Approximate)   SpO2 95%   Breastfeeding Unknown   BMI 48.70 kg/m   General: NAD CV: RRR Pulm: CTABL, nl effort ABD: s/nd/nt, fundus firm and below the umbilicus Lochia: moderate Perineum: well  approximated/intact DVT Evaluation: LE non-ttp, no evidence of DVT on exam.  Hemoglobin  Date Value Ref Range Status  06/05/2023 12.6 12.0 - 15.0 g/dL Final   HGB  Date Value Ref Range Status  08/05/2013 16.3 (H) 12.0 - 16.0 g/dL Final   HCT  Date Value Ref Range Status  06/05/2023 37.1 36.0 - 46.0 % Final  08/05/2013 45.9 35.0 - 47.0 % Final     Disposition: stable, discharge to home. Baby Feeding: formula Baby Disposition: home with mom  Rh Immune globulin given: n/a, O pos Rubella vaccine given: immune Varicella vaccine given: immune Tdap vaccine given in AP or PP setting: received AP Flu vaccine given in AP or PP setting: out of season  Contraception: Mirena  Prenatal Labs:  Blood type/Rh O pos  Antibody screen neg  Rubella Immune  Varicella Immune  RPR NR  HBsAg Neg  HIV NR  GC neg  Chlamydia neg  Genetic screening negative  1 hour GTT 82  3 hour GTT    GBS Negative     Plan:  Paying Arispe was discharged to home in good condition. Follow-up appointment with delivering provider in 2 weeks for a mood check and  6 weeks for a postpartum visit and Mirena IUD  Discharge Medications: Allergies as of 06/05/2023       Reactions   Penicillins Hives   Has patient had a PCN reaction causing immediate rash, facial/tongue/throat swelling, SOB or lightheadedness with hypotension: yes  Has patient had a PCN reaction causing severe rash involving mucus membranes or skin necrosis: no Has patient had a PCN reaction that required hospitalization: yes Has patient had a PCN reaction occurring within the last 10 years: no If all of the above answers are "NO", then may proceed with Cephalosporin use.        Medication List     STOP taking these medications    aspirin 81 MG chewable tablet       TAKE these medications    acetaminophen 325 MG tablet Commonly known as: Tylenol Take 2 tablets (650 mg total) by mouth every 4 (four) hours as needed (for pain  scale < 4).   AeroChamber MV inhaler Use as instructed   albuterol 108 (90 Base) MCG/ACT inhaler Commonly known as: VENTOLIN HFA Inhale 2 puffs into the lungs every 6 (six) hours as needed for wheezing or shortness of breath.   benzocaine-Menthol 20-0.5 % Aero Commonly known as: DERMOPLAST Apply 1 Application topically as needed for irritation (perineal discomfort).   coconut oil Oil Apply 1 Application topically as needed.   dibucaine 1 % Oint Commonly known as: NUPERCAINAL Place 1 Application rectally as needed for hemorrhoids.   ibuprofen 600 MG tablet Commonly known as: ADVIL Take 1 tablet (600 mg total) by mouth every 6 (six) hours.   levothyroxine 125 MCG tablet Commonly known as: SYNTHROID Take 1 tablet (125 mcg total) by mouth daily at 6 (six) AM. Start taking on: June 06, 2023 What changed:  medication strength how much to take when to take this additional instructions   lurasidone 40 MG Tabs tablet Commonly known as: LATUDA Take 20  mg by mouth 1 day or 1 dose. At bedtime   PNV Prenatal Plus Multivitamin 27-1 MG Tabs Take 1 tablet by mouth daily.   senna-docusate 8.6-50 MG tablet Commonly known as: Senokot-S Take 2 tablets by mouth daily.   simethicone 80 MG chewable tablet Commonly known as: MYLICON Chew 1 tablet (80 mg total) by mouth as needed for flatulence.   witch hazel-glycerin pad Commonly known as: TUCKS Apply 1 Application topically as needed for hemorrhoids.         Follow-up Information     Janyce Llanos, CNM Follow up in 2 week(s).   Specialty: Certified Nurse Midwife Why: 2wk mood check Contact information: [address] [place] [address] [phone]         Janyce Llanos, CNM Follow up in 6 week(s).   Specialty: Certified Nurse Midwife Why: 6wk postpartum and mirena IUD Contact information: [address] [place] [address] [phone]                  Signed:  Sonny Dandy, CNM 06/05/2023 4:47 PM

## 2023-06-04 NOTE — Progress Notes (Signed)
Labor Progress Note  Vannette Helmick is a 37 y.o. [phone] at [redacted]w[redacted]d by LMP admitted for induction of labor due to chronic HTN and fetal macrosomia.  Subjective: she is comfortable after her epidural  Objective: BP (!) 109/48   Pulse (!) 113   Temp 98.1 F (36.7 C) (Oral)   Resp 19   Ht 5\' 3"  (1.6 m)   Wt 124.7 kg   LMP 09/04/2022 (Approximate)   SpO2 92%   BMI 48.70 kg/m  Notable VS details: reviewed  Fetal Assessment: FHT:  FHR: 150 bpm, variability: moderate,  accelerations:  Present,  decelerations:  Present recurrent early decelerations, intermittent variable and late decelerations Category/reactivity:  Category II UC:   regular, every 1-2 minutes SVE:    Dilation: 9cm  Effacement: 90%  Station:  -1  Consistency: soft  Position: middle  Membrane status:AROM @ 1451 Amniotic color: clear  Labs: Lab Results  Component Value Date   WBC 9.7 06/04/2023   HGB 12.9 06/04/2023   HCT 38.2 06/04/2023   MCV 92.9 06/04/2023   PLT 205 06/04/2023    Assessment / Plan: 37 year old [id number] at [redacted]w[redacted]d here for IOL for chronic HTN and fetal macrosomia  Labor:  Received one dose cytotec then pitocin, pitocin at 27mu/min. AROM with clear fluid. Bloody show present. Good labor progress. Will do position changes to facilitate fetal rotation and descent. Preeclampsia:  labs stable Fetal Wellbeing:  Category II Pain Control:  Epidural I/D:   GBS negative Anticipated MOD:  NSVD  Janyce Llanos, CNM 06/04/2023, 5:05 PM

## 2023-06-04 NOTE — Progress Notes (Signed)
Labor Progress Note  Vanessa Rivera is a 37 y.o. [phone] at [redacted]w[redacted]d by LMP admitted for induction of labor due to chronic HTN and fetal macrosomia.  Subjective: she is uncomfortable with her contractions, moaning and breathing through them  Objective: BP 129/86   Pulse (!) 101   Temp 98.1 F (36.7 C) (Oral)   Resp 19   Ht 5\' 3"  (1.6 m)   Wt 124.7 kg   LMP 09/04/2022 (Approximate)   BMI 48.70 kg/m  Notable VS details: reviewed  Fetal Assessment: FHT:  FHR: 125 bpm, variability: moderate,  accelerations:  Present,  decelerations:  Absent Category/reactivity:  Category I UC:   regular, every 2-3 minutes SVE:    Dilation: 4cm  Effacement: 50%  Station:  -2  Consistency: soft  Position: middle  Membrane status:AROM @ 1451 Amniotic color: clear  Labs: Lab Results  Component Value Date   WBC 9.7 06/04/2023   HGB 12.9 06/04/2023   HCT 38.2 06/04/2023   MCV 92.9 06/04/2023   PLT 205 06/04/2023    Assessment / Plan: 37 year old [id number] at [redacted]w[redacted]d here for IOL for chronic HTN and fetal macrosomia  Labor:  Received one dose cytotec and now on pitocin. Pitocin currently at 67mu/min. AROM with clear fluid. Lots of bloody show. Good labor progress. Preeclampsia:  labs stable Fetal Wellbeing:  Category I Pain Control:  IV pain meds I/D:   GBS negative Anticipated MOD:  NSVD  Vanessa Rivera, CNM 06/04/2023, 2:53 PM

## 2023-06-04 NOTE — H&P (Signed)
OB History & Physical   History of Present Illness:  Chief Complaint:   HPI:  Vanessa Rivera is a 37 y.o. [phone] female at [redacted]w[redacted]d dated by LMP.  She presents to L&D for IOL for chronic HTN and fetal macrosomia.    Pregnancy Issues: 1. Chronic HTN (no medications) 2. Fetal macrosomia (EFW on 05/28/23 9lb 9oz, she has a proven pelvis to 9lb 15oz) 3. Hx of shoulder dystocia with 9lb 15oz baby 4. Anxiety 5. Smoking 6. AMA 7. 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 Obesity   Maternal Medical History:   Past Medical History:  Diagnosis Date   Amphetamine use disorder, moderate (HCC)    Anxiety and depression    Fetal tachycardia affecting care of mother, delivered 04/07/2019   Goiter    Hyperthyroidism    Migraine with aura 01/05/2005   Migraine with aura 01/05/2005   Normal vaginal delivery 04/07/2019   Placental abruption affecting delivery 04/07/2019   Postpartum care following vaginal delivery 04/07/2019   Preterm premature rupture of membranes (PPROM) delivered, current hospitalization 04/07/2019   Tachycardia with greater than 160 beats per minute 04/07/2019   Thyroid disease    Thyroiditis    Tobacco use affecting pregnancy in third trimester, antepartum    Vaginal bleeding during pregnancy 04/06/2019    History reviewed. No pertinent surgical history.  Allergies  Allergen Reactions   Penicillins Hives    Has patient had a PCN reaction causing immediate rash, facial/tongue/throat swelling, SOB or lightheadedness with hypotension: yes  Has patient had a PCN reaction causing severe rash involving mucus membranes or skin necrosis: no Has patient had a PCN reaction that required hospitalization: yes Has patient had a PCN reaction occurring within the last 10 years: no If all of the above answers are "NO", then may proceed with Cephalosporin use.    Prior to Admission medications   Medication Sig Start Date End Date Taking? Authorizing Provider  albuterol (VENTOLIN  HFA) 108 (90 Base) MCG/ACT inhaler Inhale 2 puffs into the lungs every 6 (six) hours as needed for wheezing or shortness of breath. 11/08/22  Yes Merwyn Katos, MD  aspirin 81 MG chewable tablet Chew 81 mg by mouth daily.   Yes [provider]  levothyroxine (SYNTHROID) 150 MCG tablet Take 250 mcg by mouth daily before breakfast.  Pt taking an extra tab on Thursday and Sunday as of 05/09/23   Yes [provider]  lurasidone (LATUDA) 40 MG TABS tablet Take 20 mg by mouth 1 day or 1 dose. At bedtime   Yes [provider]  Prenatal Vit-Fe Fumarate-FA (PNV PRENATAL PLUS MULTIVITAMIN) 27-1 MG TABS Take 1 tablet by mouth daily.   Yes [provider]  Spacer/Aero-Holding Chambers (AEROCHAMBER MV) inhaler Use as instructed 11/08/22  Yes Merwyn Katos, MD    Prenatal care site: [facility]   Social History: She  reports that she has been smoking cigarettes. She has a 3.50 pack-year smoking history. She has never used smokeless tobacco. She reports that she does not currently use alcohol. She reports that she does not currently use drugs after having used the following drugs: Methamphetamines.  Family History: family history includes Bipolar disorder in her mother; Congenital heart disease in her father; Prostate cancer in her maternal grandfather; Thyroid disease in her maternal aunt and mother.   Review of Systems: A full review of systems was performed and negative except as noted in the HPI.     Physical Exam:  Vital Signs: BP  127/71   Pulse (!) 106   Temp 97.8 F (36.6 C) (Oral)   Resp 19   Ht 5\' 3"  (1.6 m)   Wt 124.7 kg   LMP 09/04/2022 (Approximate)   BMI 48.70 kg/m  General: no acute distress.  HEENT: normocephalic, atraumatic Heart: regular rate & rhythm.  No murmurs/rubs/gallops Lungs: clear to auscultation bilaterally, normal respiratory effort Abdomen: soft, gravid, non-tender;  EFW: 9.5lb Pelvic:   External: Normal external female  genitalia  Cervix: Dilation: 2 / Effacement (%): 50 / Station: -2    Extremities: non-tender, symmetric, mild edema bilaterally.  DTRs: +2  Neurologic: Alert & oriented x 3.    Results for orders placed or performed during the hospital encounter of 06/04/23 (from the past 24 hour(s))  CBC     Status: None   Collection Time: 06/04/23  5:44 AM  Result Value Ref Range   WBC 9.7 4.0 - 10.5 K/uL   RBC 4.11 3.87 - 5.11 MIL/uL   Hemoglobin 12.9 12.0 - 15.0 g/dL   HCT 96.0 45.4 - 09.8 %   MCV 92.9 80.0 - 100.0 fL   MCH 31.4 26.0 - 34.0 pg   MCHC 33.8 30.0 - 36.0 g/dL   RDW 11.9 14.7 - 82.9 %   Platelets 205 150 - 400 K/uL   nRBC 0.0 0.0 - 0.2 %  Type and screen     Status: None   Collection Time: 06/04/23  5:44 AM  Result Value Ref Range   ABO/RH(D) O POS    Antibody Screen NEG    Sample Expiration      06/07/2023,2359 Performed at [facility], [address], [place], [place] [address]   Urine Drug Screen, Qualitative ([facility] only)     Status: None   Collection Time: 06/04/23  5:58 AM  Result Value Ref Range   Tricyclic, Ur Screen NONE DETECTED NONE DETECTED   Amphetamines, Ur Screen NONE DETECTED NONE DETECTED   MDMA (Ecstasy)Ur Screen NONE DETECTED NONE DETECTED   Cocaine Metabolite,Ur [place] NONE DETECTED NONE DETECTED   Opiate, Ur Screen NONE DETECTED NONE DETECTED   Phencyclidine (PCP) Ur S NONE DETECTED NONE DETECTED   Cannabinoid 50 Ng, Ur [place] NONE DETECTED NONE DETECTED   Barbiturates, Ur Screen NONE DETECTED NONE DETECTED   Benzodiazepine, Ur Scrn NONE DETECTED NONE DETECTED   Methadone Scn, Ur NONE DETECTED NONE DETECTED  Protein / creatinine ratio, urine     Status: Abnormal   Collection Time: 06/04/23  5:58 AM  Result Value Ref Range   Creatinine, Urine 125 mg/dL   Total Protein, Urine 21 mg/dL   Protein Creatinine Ratio 0.17 (H) 0.00 - 0.15 mg/mg[Cre]    Pertinent Results:  Prenatal Labs: Blood type/Rh O pos  Antibody screen neg  Rubella Immune   Varicella Immune  RPR NR  HBsAg Neg  HIV NR  GC neg  Chlamydia neg  Genetic screening negative  1 hour GTT 82  3 hour GTT   GBS Negative   FHT: 140bpm, moderate variability, accelerations present, no decelerations TOCO: contractions q1-37min SVE:  Dilation: 2 / Effacement (%): 50 / Station: -2    Cephalic by [employer]  [place] MFM OB FOLLOW UP  Result Date: 05/28/2023 ----------------------------------------------------------------------  OBSTETRICS REPORT                       (Signed Final 05/28/2023 09:41 am) ---------------------------------------------------------------------- Patient Info  ID #:       [id number]  D.O.B.:  1986/08/04 (36 yrs)  Name:       KEELA PLUTH                  Visit Date: 05/28/2023 08:45 am ---------------------------------------------------------------------- Performed By  Attending:        Noralee Space MD        Ref. Address:     [facility]                                                             [address]                                                             [place], [place],                                                             [place] [address]  Performed By:     Eden Lathe BS      Location[facility]         [facility]                    RDMS RVT                                 Fetal Care at                                                             [facility]  Referred By:      Haroldine Laws ---------------------------------------------------------------------- Orders  #  Description                           Code        Ordered By  1  [place] MFM OB FOLLOW UP                   19147.82    RAVI SHANKAR  2  [place] MFM FETAL BPP                      95621.3     RAVI [facility]     W/NONSTRESS ----------------------------------------------------------------------  #  Order #                     Accession #                Episode #  1  [id number]                   [id number]  [id number]  2  [id number]                    [id number]                 [id number] ---------------------------------------------------------------------- Indications  Hypothyroid (Synthroid)                        O99.280 E03.9  Obesity complicating pregnancy, third          O99.213  trimester (pregravid BMI 34)  Advanced maternal age multigravida 74+,        O10.523  third trimester  Encounter for other antenatal screening        Z36.2  follow-up  [redacted] weeks gestation of pregnancy                Z3A.38 ---------------------------------------------------------------------- Fetal Evaluation  Num Of Fetuses:         1  Fetal Heart Rate(bpm):  143  Cardiac Activity:       Observed  Presentation:           Cephalic  Placenta:               Posterior  P. Cord Insertion:      Previously visualized  Amniotic Fluid  AFI FV:      Within normal limits  AFI Sum(cm)     %Tile       Largest Pocket(cm)  18.01           70          6.02  RUQ(cm)       RLQ(cm)       LUQ(cm)        LLQ(cm)  6.02          4.04          3.8            4.15 ---------------------------------------------------------------------- Biophysical Evaluation  Amniotic F.V:   Pocket => 2 cm             F. Tone:        Observed  F. Movement:    Observed                   N.S.T:          Reactive  F. Breathing:   Observed                   Score:          10/10 ---------------------------------------------------------------------- Biometry  BPD:     88.61  mm     G. Age:  35w 6d         17  %    CI:         67.2   %    70 - 86                                                          FL/HC:      21.5   %    20.9 - 22.7  HC:    346.13   mm     G. Age:  40w 1d         79  %    HC/AC:  0.87        0.92 - 1.05  AC:    399.12   mm     G. Age:  43w 6d       > 99  %    FL/BPD:     83.8   %    71 - 87  FL:      74.28  mm     G. Age:  38w 0d         53  %    FL/AC:      18.6   %    20 - 24  Est. FW:    4337  gm      9 lb 9 oz   > 99  % ---------------------------------------------------------------------- OB History   Gravidity:    4         Term:   2        Prem:   1        SAB:   0  TOP:          0       Ectopic:  0        Living: 3 ---------------------------------------------------------------------- Gestational Age  LMP:           38w 0d        Date:  09/04/22                  EDD:   06/11/23  U/S Today:     39w 3d                                        EDD:   06/01/23  Best:          38w 0d     Det. By:  LMP  (09/04/22)          EDD:   06/11/23 ---------------------------------------------------------------------- Anatomy  Cranium:               Appears normal         LVOT:                   Previously seen  Cavum:                 Previously seen        Aortic Arch:            Previously seen  Ventricles:            Previously seen        Ductal Arch:            Previously seen  Choroid Plexus:        Previously seen        Diaphragm:              Previously seen  Cerebellum:            Previously seen        Stomach:                Appears normal, left  sided  Posterior Fossa:       Previously seen        Abdomen:                Previously seen  Nuchal Fold:           Not applicable (>20    Abdominal Wall:         Previously seen                         wks GA)  Face:                  Orbits and profile     Cord Vessels:           Previously seen                         previously seen  Lips:                  Previously seen        Kidneys:                Appear normal  Palate:                Previously             Bladder:                Appears normal                         visualized  Thoracic:              Previously seen        Spine:                  Previously seen  Heart:                 Appears normal         Upper Extremities:      Previously seen                         (4CH, axis, and                         situs)  RVOT:                  Previously seen        Lower Extremities:      Previously seen  Other:  Lenses, nasal bone and 3VV  previously visualized. Technicallly          difficult due to advanced GA and maternal habitus. ---------------------------------------------------------------------- Cervix Uterus Adnexa  Cervix  Not visualized (advanced GA >24wks)  Uterus  No abnormality visualized.  Right Ovary  Not visualized.  Left Ovary  Not visualized.  Cul De Sac  No free fluid seen.  Adnexa  No abnormality visualized ---------------------------------------------------------------------- Impression  Patient return for fetal growth assessment and antenatal  testing.  Significant weight gain in this pregnancy.  She does  not have gestational diabetes.  Blood pressure today at our  office is 125/74 mmHg.  The estimated fetal weight and the abdominal circumference  measurements are at the 99th percentile.  Amniotic fluid is  normal good fetal activity seen.  Cephalic presentation.  Antenatal testing is reassuring.  BPP 8/8.  I explained the limitations of ultrasound and accurately  estimating fetal weights.  Patient will be undergoing induction of labor next week. ----------------------------------------------------------------------                 Noralee Space, MD Electronically Signed Final Report   05/28/2023 09:41 am ----------------------------------------------------------------------  [place] MFM FETAL BPP W/NONSTRESS  Result Date: 05/28/2023 ----------------------------------------------------------------------  OBSTETRICS REPORT                       (Signed Final 05/28/2023 09:41 am) ---------------------------------------------------------------------- Patient Info  ID #:       [id number]                          D.O.B.:  October 30, 1986 (36 yrs)  Name:       MONTIE ABBASI                  Visit Date: 05/28/2023 08:45 am ---------------------------------------------------------------------- Performed By  Attending:        Noralee Space MD        Ref. Address:     [facility]                                                             [address]                                                             [place], [place],                                                             [place] [address]  Performed By:     Eden Lathe BS      Location[facility]         [facility]                    RDMS RVT                                 Fetal Care at                                                             [facility]  Referred By:      Haroldine Laws ---------------------------------------------------------------------- Orders  #  Description                           Code        Ordered By  1  [place] MFM OB FOLLOW UP  16109.60    RAVI SHANKAR  2  [place] MFM FETAL BPP                      45409.8     [facility]     W/NONSTRESS ----------------------------------------------------------------------  #  Order #                     Accession #                Episode #  1  [id number]                   [id number]                 [id number]  2  [id number]                   [id number]                 [id number] ---------------------------------------------------------------------- Indications  Hypothyroid (Synthroid)                        O99.280 E03.9  Obesity complicating pregnancy, third          O99.213  trimester (pregravid BMI 34)  Advanced maternal age multigravida 16+,        O77.523  third trimester  Encounter for other antenatal screening        Z36.2  follow-up  [redacted] weeks gestation of pregnancy                Z3A.38 ---------------------------------------------------------------------- Fetal Evaluation  Num Of Fetuses:         1  Fetal Heart Rate(bpm):  143  Cardiac Activity:       Observed  Presentation:           Cephalic  Placenta:               Posterior  P. Cord Insertion:      Previously visualized  Amniotic Fluid  AFI FV:      Within normal limits  AFI Sum(cm)     %Tile       Largest Pocket(cm)  18.01           70          6.02  RUQ(cm)       RLQ(cm)       LUQ(cm)        LLQ(cm)  6.02          4.04          3.8            4.15  ---------------------------------------------------------------------- Biophysical Evaluation  Amniotic F.V:   Pocket => 2 cm             F. Tone:        Observed  F. Movement:    Observed                   N.S.T:          Reactive  F. Breathing:   Observed                   Score:          10/10 ---------------------------------------------------------------------- Biometry  BPD:     88.61  mm     G. Age:  35w 6d         17  %  CI:         67.2   %    70 - 86                                                          FL/HC:      21.5   %    20.9 - 22.7  HC:    346.13   mm     G. Age:  40w 1d         79  %    HC/AC:      0.87        0.92 - 1.05  AC:    399.12   mm     G. Age:  43w 6d       > 99  %    FL/BPD:     83.8   %    71 - 87  FL:      74.28  mm     G. Age:  38w 0d         53  %    FL/AC:      18.6   %    20 - 24  Est. FW:    4337  gm      9 lb 9 oz   > 99  % ---------------------------------------------------------------------- OB History  Gravidity:    4         Term:   2        Prem:   1        SAB:   0  TOP:          0       Ectopic:  0        Living: 3 ---------------------------------------------------------------------- Gestational Age  LMP:           38w 0d        Date:  09/04/22                  EDD:   06/11/23  U/S Today:     39w 3d                                        EDD:   06/01/23  Best:          38w 0d     Det. By:  LMP  (09/04/22)          EDD:   06/11/23 ---------------------------------------------------------------------- Anatomy  Cranium:               Appears normal         LVOT:                   Previously seen  Cavum:                 Previously seen        Aortic Arch:            Previously seen  Ventricles:            Previously seen        Ductal Arch:            Previously seen  Choroid Plexus:  Previously seen        Diaphragm:              Previously seen  Cerebellum:            Previously seen        Stomach:                Appears normal, left                                                                         sided  Posterior Fossa:       Previously seen        Abdomen:                Previously seen  Nuchal Fold:           Not applicable (>20    Abdominal Wall:         Previously seen                         wks GA)  Face:                  Orbits and profile     Cord Vessels:           Previously seen                         previously seen  Lips:                  Previously seen        Kidneys:                Appear normal  Palate:                Previously             Bladder:                Appears normal                         visualized  Thoracic:              Previously seen        Spine:                  Previously seen  Heart:                 Appears normal         Upper Extremities:      Previously seen                         (4CH, axis, and                         situs)  RVOT:                  Previously seen        Lower Extremities:      Previously seen  Other:  Lenses, nasal bone and 3VV previously visualized. Technicallly  difficult due to advanced GA and maternal habitus. ---------------------------------------------------------------------- Cervix Uterus Adnexa  Cervix  Not visualized (advanced GA >24wks)  Uterus  No abnormality visualized.  Right Ovary  Not visualized.  Left Ovary  Not visualized.  Cul De Sac  No free fluid seen.  Adnexa  No abnormality visualized ---------------------------------------------------------------------- Impression  Patient return for fetal growth assessment and antenatal  testing.  Significant weight gain in this pregnancy.  She does  not have gestational diabetes.  Blood pressure today at our  office is 125/74 mmHg.  The estimated fetal weight and the abdominal circumference  measurements are at the 99th percentile.  Amniotic fluid is  normal good fetal activity seen.  Cephalic presentation.  Antenatal testing is reassuring.  BPP 8/8.  I explained the limitations of ultrasound and accurately  estimating fetal weights.   Patient will be undergoing induction of labor next week. ----------------------------------------------------------------------                 Noralee Space, MD Electronically Signed Final Report   05/28/2023 09:41 am ----------------------------------------------------------------------  [place] MFM FETAL BPP WO NON STRESS  Result Date: 05/09/2023 ----------------------------------------------------------------------  OBSTETRICS REPORT                       (Signed Final 05/09/2023 10:58 am) ---------------------------------------------------------------------- Patient Info  ID #:       [id number]                          D.O.B.:  1986-01-29 (36 yrs)  Name:       MARVEEN HANKES                  Visit Date: 05/09/2023 10:41 am ---------------------------------------------------------------------- Performed By  Attending:        Lin Landsman      Ref. Address:     [address]                    [place]                                                             [place],                                                             [place], [place]                                                             [address]  Performed By:     Eden Lathe BS      Location[facility]         [facility]                    RDMS RVT  Fetal Care at                                                             [facility]  Referred By:      Emogene Morgan                    MD ---------------------------------------------------------------------- Orders  #  Description                           Code        Ordered By  1  [place] MFM FETAL BPP WO NON               16109.60    [place]     STRESS                                            BOOKER ----------------------------------------------------------------------  #  Order #                     Accession #                Episode #  1  [id number]                   [id number]                 [id number]  ---------------------------------------------------------------------- Indications  Obesity complicating pregnancy, third          O99.213  trimester  Hypothyroid (Synthroid)                        O99.280 E03.9  [redacted] weeks gestation of pregnancy                Z3A.40  Advanced maternal age multigravida 20+,        O72.523  third trimester  Encounter for other antenatal screening        Z36.2  follow-up ---------------------------------------------------------------------- Fetal Evaluation  Num Of Fetuses:         1  Fetal Heart Rate(bpm):  144  Cardiac Activity:       Observed  Presentation:           Transverse, head to maternal right  Amniotic Fluid  AFI FV:      Within normal limits  AFI Sum(cm)     %Tile       Largest Pocket(cm)  19.28           72          6.27  RUQ(cm)       RLQ(cm)       LUQ(cm)        LLQ(cm)  5.23          3.45          4.33           6.27 ---------------------------------------------------------------------- Biophysical Evaluation  Amniotic F.V:   Within normal limits       F. Tone:        Observed  F. Movement:    Observed  Score:          8/8  F. Breathing:   Observed ---------------------------------------------------------------------- OB History  Gravidity:    4         Term:   2        Prem:   1        SAB:   0  TOP:          0       Ectopic:  0        Living: 3 ---------------------------------------------------------------------- Gestational Age  LMP:           35w 2d        Date:  09/04/22                  EDD:   06/11/23  Best:          Consuello Closs 2d     Det. By:  LMP  (09/04/22)          EDD:   06/11/23 ---------------------------------------------------------------------- Anatomy  Heart:                 Appears normal         Kidneys:                Appear normal                         (4CH, axis, and                         situs)  Stomach:               Appears normal, left   Bladder:                Appears normal                         sided  ---------------------------------------------------------------------- Cervix Uterus Adnexa  Cervix  Not visualized (advanced GA >24wks) ---------------------------------------------------------------------- Impression  Antenatal testing performed given maternal elevated BMI and  hypothyrodism  The biophysical profile was 8/8 with good fetal movement and  amniotic fluid volume. ---------------------------------------------------------------------- Recommendations  Follow up as clinically indicated. ----------------------------------------------------------------------              Lin Landsman, MD Electronically Signed Final Report   05/09/2023 10:58 am ----------------------------------------------------------------------   Assessment:  Ronisha Pinela is a 37 y.o. [phone] female at [redacted]w[redacted]d with IOL for chronic HTN and fetal macrosomia.   Plan:  1. Admit to Labor & Delivery; consents reviewed and obtained - Dr. Feliberto Gottron notified of admission and plan of care  2. Fetal Well being  - Fetal Tracing: Category I tracing - Group B Streptococcus ppx indicated: n/a, GBS negative - Presentation: vertex confirmed by bedside US   3. Routine OB: - Prenatal labs reviewed, as above - Rh positive - CBC, T&S, RPR on admit - Clear fluids, saline lock  4. Induction of Labor -  Contractions q1-40min, external toco in place -  Pelvis proven to 9lb 15oz -  Plan for induction with cytotec, pitocin, AROM -  Plan for continuous fetal monitoring  -  Maternal pain control as desired; requesting regional anesthesia - Discussed her previous births and she reports her first baby (9lb 15oz) got its shoulder stuck at delivery. We reviewed that this baby will likely be a similar size as her previous baby so may have a shoulder dystocia  as well. Reviewed the risks of shoulder dystocia including injury to the baby from maneuvers to deliver the baby and death from prolonged shoulder dystocia or inability to deliver. She  would like to proceed with induction of labor. - Anticipate vaginal delivery  5. Post Partum Planning: - Infant feeding: formula feeding - Contraception: TBD - Tdap: 04/2022 - Flu: declines  Janyce Llanos, CNM 06/04/23 12:04 PM

## 2023-06-04 NOTE — Anesthesia Procedure Notes (Signed)
Epidural Patient location during procedure: OB End time: 06/04/2023 3:35 PM  Staffing Performed: anesthesiologist   Preanesthetic Checklist Completed: patient identified, IV checked, site marked, risks and benefits discussed, surgical consent, monitors and equipment checked, pre-op evaluation and timeout performed  Epidural Patient position: sitting Prep: Betadine Patient monitoring: heart rate, continuous pulse ox and blood pressure Approach: midline Location: L4-L5 Injection technique: LOR saline  Needle:  Needle type: Tuohy  Needle gauge: 17 G Needle length: 9 cm and 9 Needle insertion depth: 9 cm Catheter type: closed end flexible Catheter size: 19 Gauge Catheter at skin depth: 15 cm Test dose: negative and 1.5% lidocaine with Epi 1:200 K  Assessment Events: blood not aspirated, no cerebrospinal fluid, injection not painful, no injection resistance, no paresthesia and negative IV test  Additional Notes   Patient tolerated the insertion well without complications.Reason for block:procedure for pain

## 2023-06-05 LAB — CBC
HCT: 37.1 % (ref 36.0–46.0)
Hemoglobin: 12.6 g/dL (ref 12.0–15.0)
MCH: 31.6 pg (ref 26.0–34.0)
MCHC: 34 g/dL (ref 30.0–36.0)
MCV: 93 fL (ref 80.0–100.0)
Platelets: 190 10*3/uL (ref 150–400)
RBC: 3.99 MIL/uL (ref 3.87–5.11)
RDW: 13.9 % (ref 11.5–15.5)
WBC: 11.1 10*3/uL — ABNORMAL HIGH (ref 4.0–10.5)
nRBC: 0 % (ref 0.0–0.2)

## 2023-06-05 MED ORDER — COCONUT OIL OIL: 1.0000 | TOPICAL_OIL | 0 refills | Status: DC | PRN

## 2023-06-05 MED ORDER — WITCH HAZEL-GLYCERIN EX PADS: 1.0000 | MEDICATED_PAD | CUTANEOUS | 12 refills | Status: DC | PRN

## 2023-06-05 MED ORDER — ACETAMINOPHEN 325 MG PO TABS: 650.0000 mg | ORAL_TABLET | ORAL | Status: AC | PRN

## 2023-06-05 MED ORDER — DIBUCAINE (PERIANAL) 1 % EX OINT: 1.0000 | TOPICAL_OINTMENT | CUTANEOUS | Status: DC | PRN

## 2023-06-05 MED ORDER — SIMETHICONE 80 MG PO CHEW: 80.0000 mg | CHEWABLE_TABLET | ORAL | 0 refills | Status: DC | PRN

## 2023-06-05 MED ORDER — SENNOSIDES-DOCUSATE SODIUM 8.6-50 MG PO TABS: 2.0000 | ORAL_TABLET | Freq: Every day | ORAL | Status: DC

## 2023-06-05 MED ORDER — IBUPROFEN 600 MG PO TABS: 600.0000 mg | ORAL_TABLET | Freq: Four times a day (QID) | ORAL | 0 refills | Status: DC

## 2023-06-05 MED ORDER — LEVOTHYROXINE SODIUM 125 MCG PO TABS: 125.0000 ug | ORAL_TABLET | Freq: Every day | ORAL | 0 refills | Status: AC

## 2023-06-05 MED ORDER — BENZOCAINE-MENTHOL 20-0.5 % EX AERO: 1.0000 | INHALATION_SPRAY | CUTANEOUS | Status: DC | PRN

## 2023-06-05 NOTE — Anesthesia Postprocedure Evaluation (Signed)
Anesthesia Post Note  Patient: Vanessa Rivera  Procedure(s) Performed: AN AD HOC LABOR EPIDURAL  Patient location during evaluation: Mother Baby Anesthesia Type: General Level of consciousness: oriented and awake and alert Pain management: pain level controlled Vital Signs Assessment: post-procedure vital signs reviewed and stable Respiratory status: spontaneous breathing and respiratory function stable Cardiovascular status: blood pressure returned to baseline and stable Postop Assessment: no headache, no backache, no apparent nausea or vomiting and able to ambulate Anesthetic complications: no  No notable events documented.   Last Vitals:  Vitals:   06/05/23 0350 06/05/23 0733  BP: 127/75 116/65  Pulse: 76 72  Resp: 19 18  Temp: 36.9 C 36.5 C  SpO2: 94% 94%    Last Pain:  Vitals:   06/05/23 0733  TempSrc: Oral  PainSc:                  Starling Manns

## 2023-06-05 NOTE — Progress Notes (Signed)
Patient discharged home with infant. Family present at discharge. Discharge instructions and prescriptions given and reviewed with patient. Patient verbalized understanding.  Explained importance of calling and scheduling follow-up appointments with Donato Schultz, CNM at [facility].   Extra ice packs and pads given per pt request.    Will be escorted out by staff.

## 2023-06-05 NOTE — Clinical Social Work Maternal (Signed)
  CLINICAL SOCIAL WORK MATERNAL/CHILD NOTE  Patient Details  Name: Vanessa Rivera MRN: [id number] Date of Birth: 11-21-1986  Date:  06/05/2023  Clinical Social Worker Initiating Note:  Darolyn Rua, [place] Date/Time: Initiated:  06/05/23/1537     Child's Name:  Vanessa Rivera   Biological Parents:  Mother, Father   Need for Interpreter:  None   Reason for Referral:   (high depression score 15)   Address:  [address] [place] [address]    Phone number:  [phone] (home)     Additional phone number:   Psychotropic Medications:         Pediatrician:     [facility]  Pediatrician List:   [facility]      Pediatrician Fax Number:    Risk Factors/Current Problems:      Cognitive State:  Alert     Mood/Affect:  Calm     CSW Assessment:   CSW spoke with patient regarding high depression score consult. Patient reports she feels fine and is ready to go home today, reports address in chart is her husbands address [place] Rivera, they are going through a divorce but it has not been a year yet at separate households Rivera they are not legally separated.   Patient reports she and baby will go to [address] [place], [place]  She reports baby named Vanessa Rivera, states they chose [facility] for pediatrician. States she has car seat in her car and has all needs for discharge, patient reports no discharge needs at this time.    CSW Plan/Description:  No Further Intervention Required/No Barriers to Discharge    Darolyn Rua, LCSW 06/05/2023, 3:38 PM

## 2023-06-05 NOTE — Discharge Instructions (Signed)
Discharge Instructions:   Follow-up Appointment: Call and schedule your appointments with Donato Schultz, CNM for visits in 2-weeks and 6-weeks at [facility]   If there are any new medications, they have been ordered and will be available for pickup at the listed pharmacy on your way home from the hospital.   Call office if you have any of the following: headache, visual changes, fever >101.0 F, chills, shortness of breath, breast concerns, excessive vaginal bleeding, incision drainage or problems, leg pain or redness, depression or any other concerns. If you have vaginal discharge with an odor, let your doctor know.   It is normal to bleed for up to 6 weeks. You should not soak through more than 1 pad in 1 hour. If you have a blood clot larger than your fist with continued bleeding, call your doctor.   Activity: Do not lift > 10 lbs for 6 weeks (do not lift anything heavier than your baby). No intercourse, tampons, swimming pools, hot tubs, baths (only showers) for 6 weeks.  No driving for 1-2 weeks. Continue prenatal vitamin, especially if breastfeeding. Increase calories and fluids (water) while breastfeeding.   Your milk will come in, in the next couple of days (right now it is colostrum). You may have a slight fever when your milk comes in, but it should go away on its own.  If it does not, and rises above 101 F please call the doctor. You will also feel achy and your breasts will be firm. They will also start to leak. If you are breastfeeding, continue as you have been and you can pump/express milk for comfort.   If you have too much milk, your breasts can become engorged, which could lead to mastitis. This is an infection of the milk ducts. It can be very painful and you will need to notify your doctor to obtain a prescription for antibiotics. You can also treat it with a shower or hot/cold compress.   For concerns about your baby, please call your pediatrician.  For  breastfeeding concerns, the lactation consultant can be reached at [phone].   Postpartum blues (feelings of happy one minute and sad another minute) are normal for the first few weeks but if it gets worse let your doctor know.   Congratulations! We enjoyed caring for you and your new bundle of joy!

## 2023-07-02 ENCOUNTER — Other Ambulatory Visit: Payer: MEDICAID

## 2023-09-14 ENCOUNTER — Encounter: Payer: Self-pay | Admitting: Emergency Medicine

## 2023-09-14 ENCOUNTER — Emergency Department
Admission: EM | Admit: 2023-09-14 | Discharge: 2023-09-14 | Disposition: A | Discharge status: Home / Self Care | Payer: MEDICAID | Attending: Emergency Medicine | Admitting: Emergency Medicine

## 2023-09-14 ENCOUNTER — Emergency Department: Payer: MEDICAID

## 2023-09-14 ENCOUNTER — Other Ambulatory Visit: Payer: Self-pay

## 2023-09-14 DIAGNOSIS — N3001 Acute cystitis with hematuria: Secondary | ICD-10-CM | POA: Diagnosis not present

## 2023-09-14 DIAGNOSIS — N200 Calculus of kidney: Secondary | ICD-10-CM

## 2023-09-14 DIAGNOSIS — N201 Calculus of ureter: Secondary | ICD-10-CM

## 2023-09-14 DIAGNOSIS — R109 Unspecified abdominal pain: Secondary | ICD-10-CM | POA: Diagnosis present

## 2023-09-14 DIAGNOSIS — N132 Hydronephrosis with renal and ureteral calculous obstruction: Secondary | ICD-10-CM | POA: Insufficient documentation

## 2023-09-14 DIAGNOSIS — R82998 Other abnormal findings in urine: Secondary | ICD-10-CM

## 2023-09-14 LAB — URINALYSIS, ROUTINE W REFLEX MICROSCOPIC
Bilirubin Urine: NEGATIVE
Glucose, UA: NEGATIVE mg/dL
Ketones, ur: NEGATIVE mg/dL
Nitrite: POSITIVE — AB
Protein, ur: 100 mg/dL — AB
RBC / HPF: >50 RBC/hpf (ref 0–5)
Specific Gravity, Urine: 1.02 (ref 1.005–1.030)
WBC, UA: >50 WBC/hpf (ref 0–5)
pH: 7 (ref 5.0–8.0)

## 2023-09-14 LAB — COMPREHENSIVE METABOLIC PANEL
ALT: 55 U/L — ABNORMAL HIGH (ref 0–44)
AST: 37 U/L (ref 15–41)
Albumin: 3.9 g/dL (ref 3.5–5.0)
Alkaline Phosphatase: 90 U/L (ref 38–126)
Anion gap: 9 (ref 5–15)
BUN: 17 mg/dL (ref 6–20)
CO2: 23 mmol/L (ref 22–32)
Calcium: 8.7 mg/dL — ABNORMAL LOW (ref 8.9–10.3)
Chloride: 105 mmol/L (ref 98–111)
Creatinine, Ser: 0.68 mg/dL (ref 0.44–1.00)
GFR, Estimated: >60 mL/min (ref >60)
Glucose, Bld: 138 mg/dL — ABNORMAL HIGH (ref 70–99)
Potassium: 3.8 mmol/L (ref 3.5–5.1)
Sodium: 137 mmol/L (ref 135–145)
Total Bilirubin: 0.8 mg/dL (ref 0.3–1.2)
Total Protein: 7.5 g/dL (ref 6.5–8.1)

## 2023-09-14 LAB — CBC WITH DIFFERENTIAL/PLATELET
Abs Immature Granulocytes: 0.04 10*3/uL (ref 0.00–0.07)
Basophils Absolute: 0.1 10*3/uL (ref 0.0–0.1)
Basophils Relative: 1 %
Eosinophils Absolute: 0.1 10*3/uL (ref 0.0–0.5)
Eosinophils Relative: 1 %
HCT: 40.5 % (ref 36.0–46.0)
Hemoglobin: 13.4 g/dL (ref 12.0–15.0)
Immature Granulocytes: 1 %
Lymphocytes Relative: 13 %
Lymphs Abs: 1.2 10*3/uL (ref 0.7–4.0)
MCH: 30.2 pg (ref 26.0–34.0)
MCHC: 33.1 g/dL (ref 30.0–36.0)
MCV: 91.2 fL (ref 80.0–100.0)
Monocytes Absolute: 0.5 10*3/uL (ref 0.1–1.0)
Monocytes Relative: 6 %
Neutro Abs: 6.9 10*3/uL (ref 1.7–7.7)
Neutrophils Relative %: 78 %
Platelets: 202 10*3/uL (ref 150–400)
RBC: 4.44 MIL/uL (ref 3.87–5.11)
RDW: 13.3 % (ref 11.5–15.5)
WBC: 8.7 10*3/uL (ref 4.0–10.5)
nRBC: 0 % (ref 0.0–0.2)

## 2023-09-14 LAB — PREGNANCY, URINE: Preg Test, Ur: NEGATIVE

## 2023-09-14 MED ORDER — OXYCODONE HCL 5 MG PO TABS
5.0000 mg | ORAL_TABLET | Freq: Three times a day (TID) | ORAL | 0 refills | Status: DC | PRN
Start: 2023-09-14 — End: 2023-09-30

## 2023-09-14 MED ORDER — SODIUM CHLORIDE 0.9 % IV SOLN
2.0000 g | Freq: Once | INTRAVENOUS | Status: AC
  Administered 2023-09-14: 2 g via INTRAVENOUS
  Filled 2023-09-14: qty 20

## 2023-09-14 MED ORDER — KETOROLAC TROMETHAMINE 15 MG/ML IJ SOLN
15.0000 mg | Freq: Once | INTRAMUSCULAR | Status: AC
  Administered 2023-09-14: 15 mg via INTRAVENOUS
  Filled 2023-09-14: qty 1

## 2023-09-14 MED ORDER — LACTATED RINGERS IV BOLUS
1000.0000 mL | Freq: Once | INTRAVENOUS | Status: AC
  Administered 2023-09-14: 1000 mL via INTRAVENOUS

## 2023-09-14 MED ORDER — MORPHINE SULFATE (PF) 4 MG/ML IV SOLN
4.0000 mg | Freq: Once | INTRAVENOUS | Status: AC
  Administered 2023-09-14: 4 mg via INTRAVENOUS
  Filled 2023-09-14: qty 1

## 2023-09-14 MED ORDER — TAMSULOSIN HCL 0.4 MG PO CAPS: 0.4000 mg | ORAL_CAPSULE | Freq: Every day | ORAL | 0 refills | Status: AC

## 2023-09-14 MED ORDER — SULFAMETHOXAZOLE-TRIMETHOPRIM 800-160 MG PO TABS
1.0000 | ORAL_TABLET | Freq: Two times a day (BID) | ORAL | 0 refills | Status: AC
Start: 2023-09-14 — End: 2023-09-24

## 2023-09-14 NOTE — Consult Note (Signed)
Urology Consult   I have been asked to see the patient by Dr. Anner Crete, for evaluation and management of left ureteral stone and UTI.  Chief Complaint: Left flank pain  HPI:  Vanessa Rivera is a 37 y.o. female with a number of other comorbidities who presents with 3 to 4 days of left-sided flank pain.  Evaluation in the ER showed a 5 mm left proximal ureteral stone, and urinalysis was concerning for infection with greater than 50 RBC, greater than 50 WBC, many bacteria, WBC clumps present, nitrite positive, moderate leukocytes.  No leukocytosis, normal renal function, vitals normal.  She denies any fevers or urinary symptoms at home.  She has had some chills.  She had 1 prior kidney stone that she passed spontaneously.  She would like to go home.  PMH: Past Medical History:  Diagnosis Date   Amphetamine use disorder, moderate (HCC)    Anxiety and depression    Fetal tachycardia affecting care of mother, delivered 04/07/2019   Goiter    Hyperthyroidism    Migraine with aura 01/05/2005   Migraine with aura 01/05/2005   Normal vaginal delivery 04/07/2019   Placental abruption affecting delivery 04/07/2019   Postpartum care following vaginal delivery 04/07/2019   Preterm premature rupture of membranes (PPROM) delivered, current hospitalization 04/07/2019   Tachycardia with greater than 160 beats per minute 04/07/2019   Thyroid disease    Thyroiditis    Tobacco use affecting pregnancy in third trimester, antepartum    Vaginal bleeding during pregnancy 04/06/2019    Surgical History: History reviewed. No pertinent surgical history.   Allergies:  Allergies  Allergen Reactions   Penicillins Hives    Has patient had a PCN reaction causing immediate rash, facial/tongue/throat swelling, SOB or lightheadedness with hypotension: yes  Has patient had a PCN reaction causing severe rash involving mucus membranes or skin necrosis: no Has patient had a PCN reaction that required  hospitalization: yes Has patient had a PCN reaction occurring within the last 10 years: no If all of the above answers are "NO", then may proceed with Cephalosporin use.    Family History: Family History  Problem Relation Age of Onset   Thyroid disease Mother    Bipolar disorder Mother    Congenital heart disease Father    Prostate cancer Maternal Grandfather    Thyroid disease Maternal Aunt     Social History:  reports that she has been smoking cigarettes. She has a 3.5 pack-year smoking history. She has never used smokeless tobacco. She reports that she does not currently use alcohol. She reports that she does not currently use drugs after having used the following drugs: Methamphetamines.  Physical Exam: BP 113/74 (BP Location: Right Arm)   Pulse 88   Temp 98 F (36.7 C) (Oral)   Resp 19   Ht 5\' 3"  (1.6 m)   Wt 90.7 kg   LMP 08/31/2023 (Exact Date)   SpO2 99%   BMI 35.43 kg/m    Constitutional:  Alert and oriented, No acute distress. Cardiovascular: Regular rate and rhythm Respiratory: Clear to auscultation bilaterally GI: Abdomen is soft, nontender, nondistended, no abdominal masses   Laboratory Data: Reviewed, see HPI  Pertinent Imaging: I have personally reviewed the CT scan showing a 5 mm left proximal ureteral stone.  Assessment & Plan:   37 year old female with 5 mm left proximal ureteral stone, urinalysis concerning for infection, no signs of systemic infection or fever at this time.  We discussed the need for  drainage in the setting of an infected and obstructed system.  A ureteral stent is a small plastic tube that is placed cystoscopically with one end in the kidney and the other end in the bladder that allows the infection from the kidney to drain, and relieves pain from the obstructing stone.  We discussed the risks at length including bleeding, infection, sepsis, death, ureteral injury, and stent related symptoms including urgency/frequency/dysuria/flank  pain/gross hematuria.  There is a low, but not 0, risk of inability to pass the ureteral stent alongside the stone from below which would require percutaneous nephrostomy tube by interventional radiology.  Finally, we discussed possible prolonged hospitalization and recovery, possible temporary Foley catheter placement, and 10 to 14-day course of antibiotics.  We reviewed the need for a follow-up procedure for definitive management of their stone when the infection has been treated in 2 to 3 weeks with either ureteroscopy/laser lithotripsy.  I strongly recommended cystoscopy and left ureteral stent today for drainage with her moderate size stone and grossly infected urine.  She adamantly refused, and we discussed at length the risk of sepsis, worsening flank pain, return to ER, and even death.  She still refused ureteral stent and opted for trial of passage at home with antibiotics.  Return precautions were discussed extensively.  -Recommend broad-spectrum antibiotics, Flomax, Toradol for pain -Return precautions discussed extensively, will coordinate close outpatient follow-up in 1 week in clinic  Sondra Come, MD  Total time spent on the floor was 45 minutes, with greater than 50% spent in counseling and coordination of care with the patient regarding left ureteral stone and UTI, recommendation for drainage with stent, and risk of deferring stent placement.  [facility] [address], [address] [place], [place] [address] [phone]

## 2023-09-14 NOTE — ED Provider Notes (Signed)
[facility] Provider Note    Event Date/Time   First MD Initiated Contact with Patient 09/14/23 0700     (approximate)   History   Flank Pain   HPI Vanessa Rivera is a 37 y.o. female presenting today for left flank pain.  Patient states for the past 5 days she has had intermittent left-sided flank pain.  Has taken Tylenol with some relief.  Pain waxes and wanes.  She has had chills and sweating as well.  Denies any known fevers, nausea, vomiting.  No pain with urination but feels like she has to go frequently.  Denies hematuria.  No changes to bowel movements.  Prior history of kidney stones as well as pyelonephritis.     Physical Exam   Triage Vital Signs: ED Triage Vitals  Encounter Vitals Group     BP 09/14/23 0645 113/74     Systolic BP Percentile --      Diastolic BP Percentile --      Pulse Rate 09/14/23 0645 88     Resp 09/14/23 0645 19     Temp 09/14/23 0645 98 F (36.7 C)     Temp Source 09/14/23 0645 Oral     SpO2 09/14/23 0645 99 %     Weight 09/14/23 0635 200 lb (90.7 kg)     Height 09/14/23 0635 5\' 3"  (1.6 m)     Head Circumference --      Peak Flow --      Pain Score 09/14/23 0639 7     Pain Loc --      Pain Education --      Exclude from Growth Chart --     Most recent vital signs: Vitals:   09/14/23 0645  BP: 113/74  Pulse: 88  Resp: 19  Temp: 98 F (36.7 C)  SpO2: 99%   Physical Exam: I have reviewed the vital signs and nursing notes. General: Awake, alert, no acute distress.  Nontoxic appearing. Head:  Atraumatic, normocephalic.   ENT:  EOM intact, PERRL. Oral mucosa is pink and moist with no lesions. Neck: Neck is supple with full range of motion, No meningeal signs. Cardiovascular:  RRR, No murmurs. Peripheral pulses palpable and equal bilaterally. Respiratory:  Symmetrical chest wall expansion.  No rhonchi, rales, or wheezes.  Good air movement throughout.  No use of accessory muscles.   Musculoskeletal:  No  cyanosis or edema. Moving extremities with full ROM Abdomen:  Soft, nontender, nondistended.  Left-sided CVA tenderness to palpation Neuro:  GCS 15, moving all four extremities, interacting appropriately. Speech clear. Psych:  Calm, appropriate.   Skin:  Warm, dry, no rash.    ED Results / Procedures / Treatments   Labs (all labs ordered are listed, but only abnormal results are displayed) Labs Reviewed  URINALYSIS, ROUTINE W REFLEX MICROSCOPIC - Abnormal; Notable for the following components:      Result Value   Color, Urine AMBER (*)    APPearance CLOUDY (*)    Hgb urine dipstick MODERATE (*)    Protein, ur 100 (*)    Nitrite POSITIVE (*)    Leukocytes,Ua MODERATE (*)    Bacteria, UA MANY (*)    All other components within normal limits  COMPREHENSIVE METABOLIC PANEL - Abnormal; Notable for the following components:   Glucose, Bld 138 (*)    Calcium 8.7 (*)    ALT 55 (*)    All other components within normal limits  CBC WITH DIFFERENTIAL/PLATELET  PREGNANCY, URINE  EKG    RADIOLOGY Independently interpreted CT imaging showing stone present in the proximal left ureter.   PROCEDURES:  Critical Care performed: No  Procedures   MEDICATIONS ORDERED IN ED: Medications  lactated ringers bolus 1,000 mL (0 mLs Intravenous Stopped 09/14/23 0902)  morphine (PF) 4 MG/ML injection 4 mg (4 mg Intravenous Given 09/14/23 0720)  cefTRIAXone (ROCEPHIN) 2 g in sodium chloride 0.9 % 100 mL IVPB (0 g Intravenous Stopped 09/14/23 0740)  ketorolac (TORADOL) 15 MG/ML injection 15 mg (15 mg Intravenous Given 09/14/23 0800)  morphine (PF) 4 MG/ML injection 4 mg (4 mg Intravenous Given 09/14/23 0905)     IMPRESSION / MDM / ASSESSMENT AND PLAN / ED COURSE  I reviewed the triage vital signs and the nursing notes.                              Differential diagnosis includes, but is not limited to, nephrolithiasis, pyelonephritis, acute cystitis, diverticulitis.  Patient's  presentation is most consistent with acute complicated illness / injury requiring diagnostic workup.  Patient is a 37 year old female presenting today for left flank pain and polyuria.  Vital signs are stable on arrival.  Laboratory workup shows no evidence of leukocytosis or AKI at this time.  UA is strongly indicative of a UTI.  CT imaging was ordered to evaluate for nephrolithiasis versus pyelonephritis given left-sided CVA tenderness.  There is evidence of a 5 mm stone in the proximal left ureter with mild to moderate proximal hydronephrosis.  Discussed the case with urology given concomitant UTI at this time.  Urology came to bedside evaluated patient.  He strongly recommended stent placement for drainage of her stone along with grossly infected urine.  Patient was adamantly refusing this and wanted to be discharged with oral antibiotics and follow-up.  Both myself and the urologist strongly discussed at length the risk of worsening flank pain, sepsis and even possible death.  Patient was still refusing and wanted to go home for trial of passage with antibiotics.  Patient was given ceftriaxone here in the emergency department along with multiple rounds of pain medicine.  Patient was discharged on Bactrim and Flomax.  Did not want Toradol but was okay with taking ibuprofen 600 mg every 6-8 hours scheduled.  Strict return precautions were advised and patient discharged with urology follow-up outpatient.  The patient is on the cardiac monitor to evaluate for evidence of arrhythmia and/or significant heart rate changes. Clinical Course as of 09/14/23 1054  Fri Sep 14, 2023  0713 CBC with Differential Unremarkable [DW]  0718 Urinalysis, Routine w reflex microscopic -Urine, Clean Catch(!) Positive for UTI [DW]  0730 No evidence of sepsis based on triage labs and white blood cell count.  Will get CT to evaluate for possible kidney stone and if this is negative patient can be discharged with oral  antibiotics. [DW]  0740 Comprehensive metabolic panel(!) Unremarkable [DW]  0757 Preg Test, Ur: NEGATIVE [DW]  0857 CT Renal Stone Study 4 x 5 mm left ureteropelvic junction calculus with resultant mild-to-moderate proximal hydronephrosis.  Will discuss case with urologist given urinary tract infection at this time. [DW]  6295 Spoke with Dr. Richardo Hanks with urology.  He will come assess the patient at bedside given noticeably infected UA with kidney stone to decide plan. [DW]  [phone] Urology evaluated the patient and discussed possible stenting versus discharge home.  Patient was adamant she did not want to be admitted to  the hospital and undergo stent.  Urologist recommending discharge with Bactrim x 10 days, scheduled NSAIDs, and Flomax. [DW]    Clinical Course User Index [DW] Janith Lima, MD     FINAL CLINICAL IMPRESSION(S) / ED DIAGNOSES   Final diagnoses:  Nephrolithiasis  Acute cystitis with hematuria     Rx / DC Orders   ED Discharge Orders          Ordered    sulfamethoxazole-trimethoprim (BACTRIM DS) 800-160 MG tablet  2 times daily        09/14/23 0954    oxyCODONE (ROXICODONE) 5 MG immediate release tablet  Every 8 hours PRN        09/14/23 0954    tamsulosin (FLOMAX) 0.4 MG CAPS capsule  Daily        09/14/23 0954             Note:  This document was prepared using Dragon voice recognition software and may include unintentional dictation errors.   Janith Lima, MD 09/14/23 1056

## 2023-09-14 NOTE — ED Triage Notes (Signed)
Patient ambulatory to triage with steady gait, without difficulty or distress noted; pt reports left flank pain, nonradiating x 5 days accomp by chills and urgency; tylenol taken at 530am

## 2023-09-14 NOTE — Discharge Instructions (Signed)
You were seen in the emergency department today for your left flank pain.  You do have a kidney stone on the left side and urinary tract infection.  I have sent antibiotics to your pharmacy to pick up and take as prescribed.  Also recommended taking the Flomax prescribed to your pharmacy once daily.  You should take ibuprofen 600 mg every 6-8 hours scheduled for the next 5 days help with pain.  On top of this, you can take oxycodone 5 mg every 8 hours as needed for more severe pain.  Please return immediately to the emergency department if you have any worsening symptoms.

## 2023-09-18 DIAGNOSIS — N201 Calculus of ureter: Secondary | ICD-10-CM

## 2023-09-18 HISTORY — DX: Calculus of ureter: N20.1

## 2023-09-20 ENCOUNTER — Telehealth: Payer: Self-pay | Admitting: Urology

## 2023-09-20 DIAGNOSIS — N200 Calculus of kidney: Secondary | ICD-10-CM

## 2023-09-20 NOTE — Telephone Encounter (Signed)
LMOM for pt to call office to schedule appt w/Sninsky  follow-up next week with me for urinalysis and KUB, Wednesday or Thursday would be fine

## 2023-09-30 ENCOUNTER — Other Ambulatory Visit: Payer: Self-pay

## 2023-09-30 ENCOUNTER — Emergency Department
Admission: EM | Admit: 2023-09-30 | Discharge: 2023-09-30 | Disposition: A | Discharge status: Home / Self Care | Payer: MEDICAID | Attending: Emergency Medicine | Admitting: Emergency Medicine

## 2023-09-30 ENCOUNTER — Emergency Department: Payer: MEDICAID

## 2023-09-30 DIAGNOSIS — N132 Hydronephrosis with renal and ureteral calculous obstruction: Secondary | ICD-10-CM | POA: Diagnosis not present

## 2023-09-30 DIAGNOSIS — R109 Unspecified abdominal pain: Secondary | ICD-10-CM | POA: Diagnosis present

## 2023-09-30 DIAGNOSIS — N200 Calculus of kidney: Secondary | ICD-10-CM

## 2023-09-30 LAB — URINALYSIS, ROUTINE W REFLEX MICROSCOPIC
Bilirubin Urine: NEGATIVE
Glucose, UA: NEGATIVE mg/dL
Ketones, ur: NEGATIVE mg/dL
Nitrite: NEGATIVE
Protein, ur: NEGATIVE mg/dL
Specific Gravity, Urine: 1.013 (ref 1.005–1.030)
pH: 6 (ref 5.0–8.0)

## 2023-09-30 LAB — CBC
HCT: 36.3 % (ref 36.0–46.0)
Hemoglobin: 12.5 g/dL (ref 12.0–15.0)
MCH: 30.7 pg (ref 26.0–34.0)
MCHC: 34.4 g/dL (ref 30.0–36.0)
MCV: 89.2 fL (ref 80.0–100.0)
Platelets: 262 10*3/uL (ref 150–400)
RBC: 4.07 MIL/uL (ref 3.87–5.11)
RDW: 13.1 % (ref 11.5–15.5)
WBC: 6.3 10*3/uL (ref 4.0–10.5)
nRBC: 0 % (ref 0.0–0.2)

## 2023-09-30 LAB — BASIC METABOLIC PANEL
Anion gap: 10 (ref 5–15)
BUN: 16 mg/dL (ref 6–20)
CO2: 24 mmol/L (ref 22–32)
Calcium: 8.9 mg/dL (ref 8.9–10.3)
Chloride: 102 mmol/L (ref 98–111)
Creatinine, Ser: 0.77 mg/dL (ref 0.44–1.00)
GFR, Estimated: >60 mL/min (ref >60)
Glucose, Bld: 139 mg/dL — ABNORMAL HIGH (ref 70–99)
Potassium: 3.6 mmol/L (ref 3.5–5.1)
Sodium: 136 mmol/L (ref 135–145)

## 2023-09-30 LAB — PREGNANCY, URINE: Preg Test, Ur: NEGATIVE

## 2023-09-30 MED ORDER — OXYCODONE HCL 5 MG PO TABS
5.0000 mg | ORAL_TABLET | Freq: Three times a day (TID) | ORAL | 0 refills | Status: DC | PRN
Start: 2023-09-30 — End: 2023-10-10

## 2023-09-30 NOTE — ED Notes (Signed)
See triage notes. Patient was recently treated for a UTI and kidney stone.

## 2023-09-30 NOTE — ED Triage Notes (Signed)
Pt via POV from home. Pt c/o lower back pain, states that she was seen 2 weeks ago for a UTI and kidney stone. Back pain has not gotten any better, reports she complete course of abx. Pt is A&OX4 and NAD

## 2023-09-30 NOTE — ED Provider Notes (Signed)
[facility] Provider Note    Event Date/Time   First MD Initiated Contact with Patient 09/30/23 1357     (approximate)   History   Back Pain   HPI  Vanessa Rivera is a 37 y.o. female with history of recent kidney stone and UTI presents emergency department complaining of continued left-sided flank pain.  Patient states that she has not seen the stone pass.  No fever, chills or vomiting.  No dysuria.  Patient states she is just uncomfortable.  Patient's symptoms have been going on for 2 weeks      Physical Exam   Triage Vital Signs: ED Triage Vitals  Encounter Vitals Group     BP 09/30/23 1337 (!) 156/78     Systolic BP Percentile --      Diastolic BP Percentile --      Pulse Rate 09/30/23 1337 75     Resp 09/30/23 1337 20     Temp 09/30/23 1337 98.4 F (36.9 C)     Temp Source 09/30/23 1337 Oral     SpO2 09/30/23 1337 99 %     Weight 09/30/23 1334 217 lb (98.4 kg)     Height 09/30/23 1334 5\' 3"  (1.6 m)     Head Circumference --      Peak Flow --      Pain Score 09/30/23 1334 6     Pain Loc --      Pain Education --      Exclude from Growth Chart --     Most recent vital signs: Vitals:   09/30/23 1337  BP: (!) 156/78  Pulse: 75  Resp: 20  Temp: 98.4 F (36.9 C)  SpO2: 99%     General: Awake, no distress.   CV:  Good peripheral perfusion. regular rate and  rhythm Resp:  Normal effort. Lungs CTA Abd:  No distention.  Nontender, positive CVA tenderness Other:      ED Results / Procedures / Treatments   Labs (all labs ordered are listed, but only abnormal results are displayed) Labs Reviewed  URINALYSIS, ROUTINE W REFLEX MICROSCOPIC - Abnormal; Notable for the following components:      Result Value   Color, Urine YELLOW (*)    APPearance HAZY (*)    Hgb urine dipstick SMALL (*)    Leukocytes,Ua SMALL (*)    Bacteria, UA RARE (*)    All other components within normal limits  BASIC METABOLIC PANEL - Abnormal; Notable for  the following components:   Glucose, Bld 139 (*)    All other components within normal limits  CBC  PREGNANCY, URINE     EKG     RADIOLOGY ET renal stone    PROCEDURES:   Procedures   MEDICATIONS ORDERED IN ED: Medications - No data to display   IMPRESSION / MDM / ASSESSMENT AND PLAN / ED COURSE  I reviewed the triage vital signs and the nursing notes.                              Differential diagnosis includes, but is not limited to, infected kidney stone, kidney stone, UTI, pyelonephritis, muscle strain  Patient's presentation is most consistent with acute illness / injury with system symptoms.   Since the patient recently had a kidney stone less than a week ago we will repeat her CT   CT abdomen pelvis IV contrast independently reviewed interpreted by me as  being positive for kidney stone that is in the exact same area as it was on 9/27.  I did explain these findings to the patient.  She has not called urology to follow-up.  Instructed her she has to follow-up for this kidney stone will not move out on its own.  She is to drink plenty of water.  I offered Toradol and pain medication here in the ED but she does not have a ride home and would just like to go home.  So instructed her to take over-the-counter ibuprofen.  She is given a refill of Percocet.  Continue Flomax.  Return if worsening.  Discharged in stable condition.   FINAL CLINICAL IMPRESSION(S) / ED DIAGNOSES   Final diagnoses:  Kidney stone     Rx / DC Orders   ED Discharge Orders          Ordered    oxyCODONE (ROXICODONE) 5 MG immediate release tablet  Every 8 hours PRN        09/30/23 1507             Note:  This document was prepared using Dragon voice recognition software and may include unintentional dictation errors.    Faythe Ghee, PA-C 09/30/23 1509    Loleta Rose, MD 09/30/23 2113

## 2023-09-30 NOTE — Discharge Instructions (Signed)
Follow-up with Dr. Apolinar Junes.  Your kidney stone has remained in the same place and you need to see urology. Take the pain medication as needed.  Use the Flomax also to help you urinate harder to help push the kidney stone out. You will need to call Dr. Delana Meyer office on Monday and tell them you were seen in the emergency department so they will make you an appointment.

## 2023-10-01 NOTE — Telephone Encounter (Signed)
Patient called office this morning. She was seen in the ED again on 09/30/23 for kidney stone. She said she never got the message from our office to schedule an appointment after her first ED visit on 09/14/23. Her appointment is scheduled for 10/16/23 in [place]. She was not given enough pain medication to last until then. Can she be seen any sooner?

## 2023-10-02 NOTE — Telephone Encounter (Signed)
Patient scheduled. KUB and UA ordered.

## 2023-10-03 ENCOUNTER — Encounter: Payer: Self-pay | Admitting: Urology

## 2023-10-03 ENCOUNTER — Ambulatory Visit: Payer: MEDICAID | Admitting: Urology

## 2023-10-03 ENCOUNTER — Ambulatory Visit
Admission: RE | Admit: 2023-10-03 | Discharge: 2023-10-03 | Disposition: A | Discharge status: Home / Self Care | Payer: MEDICAID | Source: Ambulatory Visit | Attending: Urology

## 2023-10-03 ENCOUNTER — Other Ambulatory Visit: Payer: Self-pay

## 2023-10-03 ENCOUNTER — Other Ambulatory Visit
Admission: RE | Admit: 2023-10-03 | Discharge: 2023-10-03 | Disposition: A | Discharge status: Home / Self Care | Payer: MEDICAID | Source: Home / Self Care | Attending: Urology | Admitting: Urology

## 2023-10-03 ENCOUNTER — Ambulatory Visit
Admission: RE | Admit: 2023-10-03 | Discharge: 2023-10-03 | Disposition: A | Discharge status: Home / Self Care | Payer: MEDICAID | Attending: Urology | Admitting: Urology

## 2023-10-03 ENCOUNTER — Telehealth: Payer: Self-pay

## 2023-10-03 VITALS — BP 117/79 | HR 71 | Ht 60.0 in | Wt 217.0 lb

## 2023-10-03 DIAGNOSIS — N201 Calculus of ureter: Secondary | ICD-10-CM | POA: Diagnosis not present

## 2023-10-03 DIAGNOSIS — N200 Calculus of kidney: Secondary | ICD-10-CM

## 2023-10-03 LAB — URINALYSIS, COMPLETE (UACMP) WITH MICROSCOPIC
Bilirubin Urine: NEGATIVE
Glucose, UA: NEGATIVE mg/dL
Hgb urine dipstick: NEGATIVE
Ketones, ur: NEGATIVE mg/dL
Nitrite: NEGATIVE
Protein, ur: NEGATIVE mg/dL
Specific Gravity, Urine: 1.02 (ref 1.005–1.030)
pH: 7 (ref 5.0–8.0)

## 2023-10-03 MED ORDER — KETOROLAC TROMETHAMINE 10 MG PO TABS: 10.0000 mg | ORAL_TABLET | Freq: Four times a day (QID) | ORAL | 0 refills | Status: DC | PRN

## 2023-10-03 NOTE — Patient Instructions (Signed)
Laser Therapy for Kidney Stones Laser therapy for kidney stones is a procedure to break up rock-like masses that form inside the kidneys (kidney stones). It is done using a device that beams a strong light (laser) on the kidney stones. This breaks the stones up into small pieces. These small pieces may leave your body when you pee (urinate) or may be taken out during the procedure.  You may need laser therapy if you have kidney stones that are painful or that are stopping you from being able to pee. Tell a health care provider about: Any allergies you have. All medicines you are taking, including vitamins, herbs, eye drops, creams, and over-the-counter medicines. Any problems you or family members have had with anesthesia. Any bleeding problems you have. Any surgeries you have had. Any medical conditions you have. Whether you are pregnant or may be pregnant. What are the risks? Your health care provider will talk with you about risks. These may include: Infection. Bleeding. Allergic reactions to medicines. Damage to: The part of your body that drains pee (urine) from the bladder (urethra). The bladder. The tube that connects the bladder to the kidneys (ureter). Urinary tract infection (UTI). Urethral stricture. This is when the urethra is narrowed by scarring. Trouble peeing. Blockage of the kidney. This may be caused by a piece of kidney stone. What happens before the procedure? When to stop eating and drinking Follow instructions from your provider about what you may eat and drink. These may include: 8 hours before the procedure Stop eating most foods. Do not eat meat, fried foods, or fatty foods. Eat only light foods, such as toast or crackers. All liquids are okay except energy drinks and alcohol. 6 hours before the procedure Stop eating. Drink only clear liquids, such as water, clear fruit juice, black coffee, plain tea, and sports drinks. Do not drink energy drinks or  alcohol. 2 hours before the procedure Stop drinking all liquids. You may be allowed to take medicines with small sips of water. If you do not follow your provider's instructions, your procedure may be delayed or canceled. Medicines Ask your provider about: Changing or stopping your regular medicines. These include any diabetes medicines or blood thinners you take. Taking medicines such as aspirin and ibuprofen. These medicines can thin your blood. Do not take them unless your provider tells you to. Taking over-the-counter medicines, vitamins, herbs, and supplements. Tests You may have a physical exam before the procedure. You may also have tests done. These may include: Imaging tests. Blood or pee tests. Surgery safety Ask your provider: How your surgery site will be marked. What steps will be taken to help prevent infection. These steps may include: Removing hair at the surgery site. Washing skin with a soap that kills germs. Taking antibiotics. General instructions Do not use any products that contain nicotine or tobacco for at least 4 weeks before the procedure. These products include cigarettes, chewing tobacco, and vaping devices, such as e-cigarettes. If you need help quitting, ask your provider. If you will be going home right after the procedure, plan to have a responsible adult: Take you home from the hospital or clinic. You will not be allowed to drive. Care for you for the time you are told. What happens during the procedure?  An IV will be inserted into one of your veins. You will be given: A sedative. This helps you relax. Anesthesia. This keeps you from feeling pain. It will make you fall asleep for surgery. A tool   with a camera on the end (ureteroscope) will be put into your urethra. It will be moved through your bladder to your kidney. It will send pictures to a screen in the operating room. This will show what parts of your kidney need to be treated. A tube will be  put through the ureteroscope. It will be moved into your kidney. The laser device will be put into your kidney through the tube. The laser will be used to break up the kidney stones. A tool with a tiny wire basket may be put through the tube into your kidney. This can help remove the small pieces of the kidney stone. A small mesh tube (stent) may be placed to allow your kidney to drain. The tube and ureteroscope will be taken out at the end of the surgery. The procedure may vary among providers and hospitals. What happens after the procedure? Your blood pressure, heart rate, breathing rate, and blood oxygen level will be monitored until you leave the hospital or clinic. If you had a stent placed, it may have a string that will be secured to your skin. This helps your provider remove the stent. You may be given a strainer to collect any stone pieces that you pass in your pee. Your provider may have these tested. This information is not intended to replace advice given to you by your health care provider. Make sure you discuss any questions you have with your health care provider. Document Revised: 08/04/2022 Document Reviewed: 08/04/2022 Elsevier Patient Education  2024 Elsevier Inc.  Ureteral Stent Implantation Ureteral stent implantation is a procedure to insert (implant) a flexible, soft, plastic tube (stent) into a ureter. Ureters are the tubelike parts of the body that drain urine from the kidneys. A ureteral stent may be implanted: After a procedure to remove a blockage from the ureter (ureterolysis or pyeloplasty). To open the flow of urine when a blockage is caused by a kidney stone, tumor, blood clot, or infection. You have two ureters, one on each side of your body. The ureters connect your kidneys to your bladder. The stent is placed so that one end is in your kidney, and one end is in your bladder. The stent supports the ureter while it heals and helps to drain urine. The stent is  usually taken out after your ureter has healed. Depending on your condition, you may have a stent for just a few weeks, or you may have a long-term stent that will need to be replaced every few months. Tell a health care provider about: Any allergies you have. All medicines you are taking, including vitamins, herbs, eye drops, creams, and over-the-counter medicines. Any problems you or family members have had with anesthetic medicines. Any bleeding problems you have. Any surgeries you have had. Any medical conditions you have. Whether you are pregnant or may be pregnant. What are the risks? Generally, this is a safe procedure. However, problems may occur, including: Infection. Bleeding. Allergic reactions to medicines. Damage to nearby structures or organs, such as tearing (perforation) of the ureter. Movement of the stent away from where it is placed during surgery (migration). Buildup of a crust or hard coating (encrustation) on the stent. This happens when bacteria in the body form crystals on the stent, causing it to weaken. What happens before the procedure? Medicines Ask your health care provider about: Changing or stopping your regular medicines. These include any diabetes medicines or blood thinners you take. Taking medicines such as aspirin and ibuprofen. These   medicines can thin your blood. Do not take them unless your health care provider tells you to. Taking over-the-counter medicines, vitamins, herbs, and supplements. When to stop eating and drinking Follow instructions from your health care provider about what you may eat and drink. These may include: 8 hours before your procedure Stop eating most foods. Do not eat meat, fried foods, or fatty foods. Eat only light foods, such as toast or crackers. All liquids are okay except energy drinks and alcohol. 6 hours before your procedure Stop eating. Drink only clear liquids, such as water, clear fruit juice, black coffee, plain  tea, and sports drinks. Do not drink energy drinks or alcohol. 2 hours before your procedure Stop drinking all liquids. You may be allowed to take medicines with small sips of water. If you do not follow your health care provider's instructions, your procedure may be delayed or canceled. General instructions Do not use any products that contain nicotine or tobacco for at least 4 weeks before the procedure. These products include cigarettes, chewing tobacco, and vaping devices, such as e-cigarettes. If you need help quitting, ask your health care provider. You may have an exam or testing, such as imaging or blood tests. If you will be going home right after the procedure, plan to have a responsible adult: Take you home from the hospital or clinic. You will not be allowed to drive. Care for you for the time you are told. Ask your health care provider what steps will be taken to help prevent infection. These steps may include: Removing hair at the surgery site. Washing skin with a soap that kills germs. Taking antibiotic medicine. What happens during the procedure? An IV will be inserted into one of your veins. You may be given: A medicine to help you relax (sedative). A medicine to make you fall asleep (general anesthetic). A thin, tube-shaped instrument with a light and tiny camera at the end (cystoscope) will be inserted into your urethra. The urethra is the part of your body that drains urine from the bladder. The urethra opens at the end of the penis or in front of the vaginal opening. The cystoscope will be passed into your bladder. Guided imagery using X-ray may be used to pass a thin wire (guide wire) through your bladder and into your ureter. This wire is used to guide the stent into your ureter. The stent will be inserted into your ureter. The guide wire and the cystoscope will be removed. A thin, flexible tube (catheter) may be put through your urethra so that one end is in your  bladder. This helps to drain urine from your bladder. The procedure may vary among hospitals and health care providers. What happens after the procedure? Your blood pressure, heart rate, breathing rate, and blood oxygen level will be monitored until you leave the hospital or clinic. You may continue to get medicine and fluids through an IV. You may have some soreness or pain in your abdomen and urethra. You may be given medicines for this. You will be encouraged to get up and walk around as soon as you can. You may have a catheter draining your urine. Summary Ureteral stent implantation is a procedure to insert a flexible, soft, plastic tube (stent) into a ureter. You may have a stent implanted to support the ureter while it heals after a procedure or to open the flow of urine if there is a blockage. You may have a stent for just a few weeks, or you   may have a long-term stent that will need to be replaced every few months. Follow instructions from your health care provider about taking medicines and about eating and drinking before the procedure. This information is not intended to replace advice given to you by your health care provider. Make sure you discuss any questions you have with your health care provider. Document Revised: 01/09/2022 Document Reviewed: 01/09/2022 Elsevier Patient Education  2024 Elsevier Inc.  

## 2023-10-03 NOTE — Telephone Encounter (Signed)
Per Dr. Richardo Hanks, Patient is to be scheduled for Left Ureteroscopy with Laser Lithotripsy and Stent Placement   Vanessa Rivera was contacted and possible surgical dates were discussed, Friday October 18th, 2024 was agreed upon for surgery.   Patient was directed to call [phone] between 1-3pm the day before surgery to find out surgical arrival time.  Instructions were given not to eat or drink from midnight on the night before surgery and have a driver for the day of surgery. On the surgery day patient was instructed to enter through the [facility] entrance of [facility] report the Same Day Surgery desk.   Pre-Admit Testing will be in contact via phone to set up an interview with the anesthesia team to review your history and medications prior to surgery.   Reminder of this information was sent via MyChart to the patient.

## 2023-10-03 NOTE — Progress Notes (Signed)
Surgical Physician Order Form [facility] [place]  Dr. Legrand Rams, MD  * Scheduling expectation : Friday 10/18, patient prefers noon or later  *Length of Case: 1 hour  *Clearance needed: no  *Anticoagulation Instructions: May continue all anticoagulants  *Aspirin Instructions: Ok to continue all  *Post-op visit Date/Instructions:  tbd  *Diagnosis: Left Ureteral Stone  *Procedure: left Ureteroscopy w/laser lithotripsy & stent placement (16109)   Additional orders: N/A  -Admit type: OUTpatient  -Anesthesia: General  -VTE Prophylaxis Standing Order SCD's       Other:   -Standing Lab Orders Per Anesthesia    Lab other: UA and culture sent 10/16  -Standing Test orders EKG/Chest x-ray per Anesthesia       Test other:   - Medications:  Cipro 400mg  IV  -Other orders:  N/A

## 2023-10-03 NOTE — Telephone Encounter (Signed)
Patient states that Toradol does not work for her with her pain. She has been taking Ibuprofen and Tylenol with little relief. Wanted to know if you could send her in something stronger to [facility] on [address] in [place].

## 2023-10-03 NOTE — Progress Notes (Signed)
10/03/2023 11:42 AM   Vanessa Rivera October 31, 1986 [id number]  Reason for visit: Follow up nephrolithiasis  HPI: 37 year old female I originally saw in consultation in the ER on 09/14/2023 for left-sided flank pain with a 5 mm left proximal ureteral stone and urinalysis concerning for infection.  I recommended stent placement at that time and she left AMA.  Unfortunately urine was not sent for culture, she was treated with Bactrim.  She has not followed up with urology as scheduled.  She returned to the ER on 09/30/2023 with recurrent left-sided flank pain, and CT showed persistent 5 mm left proximal ureteral stone with hydronephrosis, urinalysis today is benign.  She continues to report intermittent left-sided flank pain, but denies dysuria or fevers.  I personally viewed and interpreted the prior CT, as well as the KUB today and on the KUB difficult to visualize left proximal ureteral stone, density ~450HU and may be uric acid.  We discussed various treatment options for urolithiasis including observation with or without medical expulsive therapy, shockwave lithotripsy (SWL), ureteroscopy and laser lithotripsy with stent placement, and percutaneous nephrolithotomy.We discussed that management is based on stone size, location, density, patient co-morbidities, and patient preference. Stones <17mm in size have a >80% spontaneous passage rate. Data surrounding the use of tamsulosin for medical expulsive therapy is controversial, but meta analyses suggests it is most efficacious for distal stones between 5-43mm in size. Possible side effects include dizziness/lightheadedness, and retrograde ejaculation.SWL has a lower stone free rate in a single procedure, but also a lower complication rate compared to ureteroscopy and avoids a stent and associated stent related symptoms. Possible complications include renal hematoma, steinstrasse, and need for additional treatment.Ureteroscopy with laser lithotripsy and stent  placement has a higher stone free rate than SWL in a single procedure, however increased complication rate including possible infection, ureteral injury, bleeding, and stent related morbidity. Common stent related symptoms include dysuria, urgency/frequency, and flank pain.  Schedule left ureteroscopy, laser lithotripsy, stent placement this Friday Toradol sent in  Sondra Come, MD  [facility] [address] [place], [place] [address] [phone]

## 2023-10-03 NOTE — Progress Notes (Signed)
   [facility] Surgical Posting Form  Surgery Date: Date: 10/05/2023  Surgeon: Dr. Legrand Rams, MD  Inpt ( No  )   Outpt (Yes)   Obs ( No  )   Diagnosis: N20.1 Left Ureteral Stone  -CPT: [id number]  Surgery: Left Ureteroscopy with Laser Lithotripsy and Stent Placement  Stop Anticoagulations: No  Cardiac/Medical/Pulmonary Clearance needed: no  *Orders entered into EPIC  Date: 10/03/23   *Case booked in [place]  Date: 10/03/23  *Notified pt of Surgery: Date: 10/03/23  PRE-OP UA & CX: yes, obtained in clinic today 10/03/2023  *Placed into Prior Authorization Work Angela Nevin Date: 10/03/23  Assistant/laser/rep:No

## 2023-10-04 ENCOUNTER — Encounter: Payer: Self-pay | Admitting: Urology

## 2023-10-04 ENCOUNTER — Other Ambulatory Visit: Payer: Self-pay | Admitting: Urology

## 2023-10-04 MED ORDER — OXYCODONE HCL 5 MG PO TABS
5.0000 mg | ORAL_TABLET | Freq: Four times a day (QID) | ORAL | 0 refills | Status: DC | PRN
Start: 2023-10-04 — End: 2023-10-10

## 2023-10-05 ENCOUNTER — Ambulatory Visit: Admission: RE | Admit: 2023-10-05 | Payer: MEDICAID | Source: Home / Self Care | Admitting: Urology

## 2023-10-05 ENCOUNTER — Encounter: Admission: RE | Payer: Self-pay | Source: Home / Self Care

## 2023-10-05 DIAGNOSIS — Z8679 Personal history of other diseases of the circulatory system: Secondary | ICD-10-CM

## 2023-10-05 DIAGNOSIS — Z01812 Encounter for preprocedural laboratory examination: Secondary | ICD-10-CM

## 2023-10-05 DIAGNOSIS — E0590 Thyrotoxicosis, unspecified without thyrotoxic crisis or storm: Secondary | ICD-10-CM

## 2023-10-05 SURGERY — CYSTOSCOPY/URETEROSCOPY/HOLMIUM LASER/STENT PLACEMENT
Anesthesia: General | Laterality: Left

## 2023-10-10 ENCOUNTER — Encounter
Admission: RE | Admit: 2023-10-10 | Discharge: 2023-10-10 | Disposition: A | Discharge status: Home / Self Care | Payer: MEDICAID | Source: Ambulatory Visit | Attending: Urology | Admitting: Urology

## 2023-10-10 ENCOUNTER — Other Ambulatory Visit: Payer: Self-pay

## 2023-10-10 HISTORY — DX: Cardiac murmur, unspecified: R01.1

## 2023-10-10 HISTORY — DX: Anemia, unspecified: D64.9

## 2023-10-10 HISTORY — DX: Hypothyroidism, unspecified: E03.9

## 2023-10-10 NOTE — Patient Instructions (Addendum)
Your procedure is scheduled on: 10/12/23 - Friday Report to the Registration Desk on the 1st floor of the Medical Mall. To find out your arrival time, please call [phone] between 1PM - 3PM on: 10/11/23 - Thursday If your arrival time is 6:00 am, do not arrive before that time as the Medical Mall entrance doors do not open until 6:00 am.  REMEMBER: Instructions that are not followed completely may result in serious medical risk, up to and including death; or upon the discretion of your surgeon and anesthesiologist your surgery may need to be rescheduled.  Do not eat food or drink any fluids after midnight the night before surgery.  No gum chewing or hard candies.   One week prior to surgery: Stop Anti-inflammatories (NSAIDS) such as Advil, Aleve, Ibuprofen, Motrin, Naproxen, Naprosyn and Aspirin based products such as Excedrin, Goody's Powder, BC Powder. You may take Tylenol if needed for pain up until the day of surgery.  Stop ANY OVER THE COUNTER supplements until after surgery.  HOLD ketorolac (TORADOL) until after your surgery.   ON THE DAY OF SURGERY ONLY TAKE THESE MEDICATIONS WITH SIPS OF WATER:  propranolol (INDERAL)  levothyroxine (SYNTHROID)   Use inhaler albuterol (VENTOLIN HFA)  on the day of surgery and bring to the hospital.  No Alcohol for 24 hours before or after surgery.  No Smoking including e-cigarettes for 24 hours before surgery.  No chewable tobacco products for at least 6 hours before surgery.  No nicotine patches on the day of surgery.  Do not use any "recreational" drugs for at least a week (preferably 2 weeks) before your surgery.  Please be advised that the combination of cocaine and anesthesia may have negative outcomes, up to and including death. If you test positive for cocaine, your surgery will be cancelled.  On the morning of surgery brush your teeth with toothpaste and water, you may rinse your mouth with mouthwash if you wish. Do not  swallow any toothpaste or mouthwash.  Do not wear jewelry, make-up, hairpins, clips or nail polish.  For welded (permanent) jewelry: bracelets, anklets, waist bands, etc.  Please have this removed prior to surgery.  If it is not removed, there is a chance that hospital personnel will need to cut it off on the day of surgery.  Do not wear lotions, powders, or perfumes.   Do not shave body hair from the neck down 48 hours before surgery.  Contact lenses, hearing aids and dentures may not be worn into surgery.  Do not bring valuables to the hospital. [facility] is not responsible for any missing/lost belongings or valuables.   Notify your doctor if there is any change in your medical condition (cold, fever, infection).  Wear comfortable clothing (specific to your surgery type) to the hospital.  After surgery, you can help prevent lung complications by doing breathing exercises.  Take deep breaths and cough every 1-2 hours. Your doctor may order a device called an Incentive Spirometer to help you take deep breaths. When coughing or sneezing, hold a pillow firmly against your incision with both hands. This is called "splinting." Doing this helps protect your incision. It also decreases belly discomfort.  If you are being admitted to the hospital overnight, leave your suitcase in the car. After surgery it may be brought to your room.  In case of increased patient census, it may be necessary for you, the patient, to continue your postoperative care in the Same Day Surgery department.  If you  are being discharged the day of surgery, you will not be allowed to drive home. You will need a responsible individual to drive you home and stay with you for 24 hours after surgery.   If you are taking public transportation, you will need to have a responsible individual with you.  Please call the Pre-admissions Testing Dept. at [phone] if you have any questions about these  instructions.  Surgery Visitation Policy:  Patients having surgery or a procedure may have two visitors.  Children under the age of 96 must have an adult with them who is not the patient.  Inpatient Visitation:    Visiting hours are 7 a.m. to 8 p.m. Up to four visitors are allowed at one time in a patient room. The visitors may rotate out with other people during the day.  One visitor age 17 or older may stay with the patient overnight and must be in the room by 8 p.m.

## 2023-10-11 ENCOUNTER — Other Ambulatory Visit: Payer: Self-pay | Admitting: Urology

## 2023-10-11 MED ORDER — CIPROFLOXACIN IN D5W 400 MG/200ML IV SOLN: 400.0000 mg | INTRAVENOUS | Status: DC

## 2023-10-11 MED ORDER — OXYCODONE HCL 5 MG PO TABS
5.0000 mg | ORAL_TABLET | Freq: Four times a day (QID) | ORAL | 0 refills | Status: AC | PRN
Start: 2023-10-11 — End: ?

## 2023-10-11 MED ORDER — ORAL CARE MOUTH RINSE: 15.0000 mL | Freq: Once | OROMUCOSAL | Status: DC

## 2023-10-11 MED ORDER — LACTATED RINGERS IV SOLN: INTRAVENOUS | Status: AC

## 2023-10-11 MED ORDER — CHLORHEXIDINE GLUCONATE 0.12 % MT SOLN: 15.0000 mL | Freq: Once | OROMUCOSAL | Status: DC

## 2023-10-12 ENCOUNTER — Encounter: Payer: Self-pay | Admitting: Certified Registered"

## 2023-10-12 ENCOUNTER — Ambulatory Visit
Admission: RE | Admit: 2023-10-12 | Discharge: 2023-10-12 | Disposition: A | Discharge status: Home / Self Care | Payer: MEDICAID | Attending: Urology | Admitting: Urology

## 2023-10-12 ENCOUNTER — Encounter
Admission: RE | Disposition: A | Discharge status: Home / Self Care | Payer: Self-pay | Source: Home / Self Care | Attending: Urology

## 2023-10-12 DIAGNOSIS — Z8679 Personal history of other diseases of the circulatory system: Secondary | ICD-10-CM

## 2023-10-12 DIAGNOSIS — Z01812 Encounter for preprocedural laboratory examination: Secondary | ICD-10-CM

## 2023-10-12 DIAGNOSIS — N201 Calculus of ureter: Secondary | ICD-10-CM

## 2023-10-12 DIAGNOSIS — E0590 Thyrotoxicosis, unspecified without thyrotoxic crisis or storm: Secondary | ICD-10-CM

## 2023-10-12 SURGERY — CYSTOSCOPY/URETEROSCOPY/HOLMIUM LASER/STENT PLACEMENT
Anesthesia: General | Laterality: Left

## 2023-10-12 MED ORDER — CIPROFLOXACIN IN D5W 400 MG/200ML IV SOLN
INTRAVENOUS | Status: AC
  Filled 2023-10-12: qty 200

## 2023-10-12 MED ORDER — CHLORHEXIDINE GLUCONATE 0.12 % MT SOLN
OROMUCOSAL | Status: AC
  Filled 2023-10-12: qty 15

## 2023-10-12 SURGICAL SUPPLY — 31 items
ADH LQ OCL WTPRF AMP STRL LF (MISCELLANEOUS)
ADHESIVE MASTISOL STRL (MISCELLANEOUS) IMPLANT
BAG DRAIN SIEMENS DORNER NS (MISCELLANEOUS) ×1 IMPLANT
BAG DRN NS LF (MISCELLANEOUS) ×1
BAG PRESSURE INF REUSE 3000 (BAG) ×1 IMPLANT
BRUSH SCRUB EZ 1% IODOPHOR (MISCELLANEOUS) ×1 IMPLANT
CATH URET FLEX-TIP 2 LUMEN 10F (CATHETERS) IMPLANT
CATH URETL OPEN 5X70 (CATHETERS) IMPLANT
CNTNR URN SCR LID CUP LEK RST (MISCELLANEOUS) IMPLANT
CONT SPEC 4OZ STRL OR WHT (MISCELLANEOUS)
DRAPE UTILITY 15X26 TOWEL STRL (DRAPES) ×1 IMPLANT
DRSG TEGADERM 2-3/8X2-3/4 SM (GAUZE/BANDAGES/DRESSINGS) IMPLANT
FIBER LASER MOSES 200 DFL (Laser) IMPLANT
FIBER LASER MOSES 365 DFL (Laser) IMPLANT
GLOVE BIOGEL PI IND STRL 7.5 (GLOVE) ×1 IMPLANT
GOWN STRL REUS W/ TWL LRG LVL3 (GOWN DISPOSABLE) ×1 IMPLANT
GOWN STRL REUS W/ TWL XL LVL3 (GOWN DISPOSABLE) ×1 IMPLANT
GOWN STRL REUS W/TWL LRG LVL3 (GOWN DISPOSABLE) ×1
GOWN STRL REUS W/TWL XL LVL3 (GOWN DISPOSABLE) ×1
GUIDEWIRE STR DUAL SENSOR (WIRE) ×1 IMPLANT
IV NS IRRIG 3000ML ARTHROMATIC (IV SOLUTION) ×1 IMPLANT
KIT TURNOVER CYSTO (KITS) ×1 IMPLANT
PACK CYSTO AR (MISCELLANEOUS) ×1 IMPLANT
SET CYSTO W/LG BORE CLAMP LF (SET/KITS/TRAYS/PACK) ×1 IMPLANT
SHEATH NAVIGATOR HD 12/14X36 (SHEATH) IMPLANT
STENT URET 6FRX24 CONTOUR (STENTS) IMPLANT
STENT URET 6FRX26 CONTOUR (STENTS) IMPLANT
SURGILUBE 2OZ TUBE FLIPTOP (MISCELLANEOUS) ×1 IMPLANT
SYR 10ML LL (SYRINGE) ×1 IMPLANT
VALVE UROSEAL ADJ ENDO (VALVE) IMPLANT
WATER STERILE IRR 500ML POUR (IV SOLUTION) ×1 IMPLANT

## 2023-10-12 NOTE — Progress Notes (Signed)
The patient checked in and 30 minutes later patient name was called in the waiting room to have patient brought back to begin pre-op. Name was called several times including going in to the bathroom and calling the patients name in there. Patient was called and phone call went automatically to voicemail. Patient contact was called with no answer. Message was sent to contact listed to receive text messages. Patient name was again called in the lobby 1.5 hours later with no response. The office was called to make sure patient had not gone down to wait in the clinic but patient was not there either.

## 2023-10-16 ENCOUNTER — Ambulatory Visit: Payer: Self-pay | Admitting: Urology

## 2023-10-31 ENCOUNTER — Telehealth: Payer: Self-pay

## 2023-10-31 ENCOUNTER — Encounter: Payer: Self-pay | Admitting: Emergency Medicine

## 2023-10-31 ENCOUNTER — Other Ambulatory Visit: Payer: Self-pay

## 2023-10-31 ENCOUNTER — Emergency Department
Admission: EM | Admit: 2023-10-31 | Discharge: 2023-10-31 | Disposition: A | Discharge status: Home / Self Care | Payer: MEDICAID | Attending: Emergency Medicine | Admitting: Emergency Medicine

## 2023-10-31 ENCOUNTER — Other Ambulatory Visit: Payer: Self-pay | Admitting: Urology

## 2023-10-31 DIAGNOSIS — N200 Calculus of kidney: Secondary | ICD-10-CM | POA: Diagnosis not present

## 2023-10-31 DIAGNOSIS — N390 Urinary tract infection, site not specified: Secondary | ICD-10-CM | POA: Insufficient documentation

## 2023-10-31 DIAGNOSIS — R1032 Left lower quadrant pain: Secondary | ICD-10-CM | POA: Diagnosis present

## 2023-10-31 DIAGNOSIS — E039 Hypothyroidism, unspecified: Secondary | ICD-10-CM | POA: Diagnosis not present

## 2023-10-31 DIAGNOSIS — N201 Calculus of ureter: Secondary | ICD-10-CM

## 2023-10-31 LAB — URINALYSIS, ROUTINE W REFLEX MICROSCOPIC
Bilirubin Urine: NEGATIVE
Bilirubin Urine: NEGATIVE
Glucose, UA: NEGATIVE mg/dL
Glucose, UA: NEGATIVE mg/dL
Ketones, ur: 5 mg/dL — AB
Ketones, ur: 5 mg/dL — AB
Nitrite: POSITIVE — AB
Nitrite: NEGATIVE
Protein, ur: 100 mg/dL — AB
Protein, ur: 100 mg/dL — AB
RBC / HPF: >50 RBC/hpf (ref 0–5)
Specific Gravity, Urine: 1.024 (ref 1.005–1.030)
Specific Gravity, Urine: 1.027 (ref 1.005–1.030)
WBC, UA: >50 WBC/hpf (ref 0–5)
WBC, UA: >50 WBC/hpf (ref 0–5)
pH: 6 (ref 5.0–8.0)
pH: 5 (ref 5.0–8.0)

## 2023-10-31 LAB — BASIC METABOLIC PANEL WITH GFR
Anion gap: 10 (ref 5–15)
BUN: 17 mg/dL (ref 6–20)
CO2: 23 mmol/L (ref 22–32)
Calcium: 9 mg/dL (ref 8.9–10.3)
Chloride: 103 mmol/L (ref 98–111)
Creatinine, Ser: 0.68 mg/dL (ref 0.44–1.00)
GFR, Estimated: >60 mL/min
Glucose, Bld: 103 mg/dL — ABNORMAL HIGH (ref 70–99)
Potassium: 3.7 mmol/L (ref 3.5–5.1)
Sodium: 136 mmol/L (ref 135–145)

## 2023-10-31 LAB — CBC
HCT: 39.8 % (ref 36.0–46.0)
Hemoglobin: 13.5 g/dL (ref 12.0–15.0)
MCH: 30.4 pg (ref 26.0–34.0)
MCHC: 33.9 g/dL (ref 30.0–36.0)
MCV: 89.6 fL (ref 80.0–100.0)
Platelets: 365 10*3/uL (ref 150–400)
RBC: 4.44 MIL/uL (ref 3.87–5.11)
RDW: 13 % (ref 11.5–15.5)
WBC: 12.6 10*3/uL — ABNORMAL HIGH (ref 4.0–10.5)
nRBC: 0 % (ref 0.0–0.2)

## 2023-10-31 LAB — POC URINE PREG, ED: Preg Test, Ur: NEGATIVE

## 2023-10-31 MED ORDER — OXYCODONE HCL 5 MG PO TABS
5.0000 mg | ORAL_TABLET | ORAL | Status: AC
  Administered 2023-10-31: 5 mg via ORAL
  Filled 2023-10-31: qty 1

## 2023-10-31 MED ORDER — SULFAMETHOXAZOLE-TRIMETHOPRIM 800-160 MG PO TABS: 1.0000 | ORAL_TABLET | Freq: Two times a day (BID) | ORAL | 0 refills | Status: DC

## 2023-10-31 MED ORDER — SULFAMETHOXAZOLE-TRIMETHOPRIM 800-160 MG PO TABS
1.0000 | ORAL_TABLET | Freq: Once | ORAL | Status: AC
Start: 2023-10-31 — End: 2023-10-31
  Administered 2023-10-31: 1 via ORAL
  Filled 2023-10-31: qty 1

## 2023-10-31 NOTE — ED Notes (Signed)
Pt. Speaking to urology on cordless dept. Phone.

## 2023-10-31 NOTE — Telephone Encounter (Signed)
Per Dr. Richardo Hanks, Patient is to be scheduled for Left Ureteroscopy with Laser Lithotripsy and Stent Placement   Vanessa Rivera was contacted and possible surgical dates were discussed, Friday November 15th, 2024 was agreed upon for surgery.     Patient was directed to call [phone] between 1-3pm the day before surgery to find out surgical arrival time.  Instructions were given not to eat or drink from midnight on the night before surgery and have a driver for the day of surgery. On the surgery day patient was instructed to enter through the [facility] entrance of [facility] report the Same Day Surgery desk.   Pre-Admit Testing will be in contact via phone to set up an interview with the anesthesia team to review your history and medications prior to surgery.   Reminder of this information was sent via MyChart to the patient.

## 2023-10-31 NOTE — Progress Notes (Signed)
Surgical Physician Order Form [facility] [place]  Dr. Legrand Rams, MD  * Scheduling expectation : Friday 11/15  *Length of Case: 1 hour  *Clearance needed: no  *Anticoagulation Instructions: May continue all anticoagulants  *Aspirin Instructions: Ok to continue Aspirin  *Post-op visit Date/Instructions:  tbd  *Diagnosis: Left Ureteral Stone  *Procedure: left Ureteroscopy w/laser lithotripsy & stent placement (41324)   Additional orders: N/A  -Admit type: OUTpatient  -Anesthesia: General  -VTE Prophylaxis Standing Order SCD's       Other:   -Standing Lab Orders Per Anesthesia    Lab other: None  -Standing Test orders EKG/Chest x-ray per Anesthesia       Test other:   - Medications:  Cipro 400mg  IV  -Other orders:  N/A

## 2023-10-31 NOTE — Progress Notes (Signed)
   [facility] Surgical Posting Form  Surgery Date: Date: 11/02/2023  Surgeon: Dr. Legrand Rams, MD  Inpt ( No  )   Outpt (Yes)   Obs ( No  )   Diagnosis: N20.1 Left Ureteral Stone  -CPT: [phone]  Surgery: Left Ureteroscopy with Laser Lithotripsy and Stent Placement  Stop Anticoagulations: No and may continue ASA  Cardiac/Medical/Pulmonary Clearance needed: no  *Orders entered into EPIC  Date: 10/31/23   *Case booked in [place]  Date: 10/31/23  *Notified pt of Surgery: Date: 10/31/23  PRE-OP UA & CX: no  *Placed into Prior Authorization Work [place] Date: 10/31/23  Assistant/laser/rep:No

## 2023-10-31 NOTE — ED Provider Notes (Signed)
[facility] Provider Note    Event Date/Time   First MD Initiated Contact with Patient 10/31/23 1026     (approximate)   History   Flank Pain   HPI  Vanessa Rivera is a 37 y.o. female with a history of left-sided nephrolithiasis.  Hypothyroidism.  Prior concern for septic kidney stone.  Patient presents today advising that she has ongoing pain for the last month from her left sided kidney stone.  She has canceled her previous appointment with urology for various reasons.  She tells me today that she needs to be discharged soon and would like pain control.  She needs to leave for a court date that is scheduled at 2 PM involving her children  She does advise to me that she is not making any court decisions herself, she is not signing any legal documents but will be receiving additional information about her case from the judge today with her attorney present.  She drove herself here but advises she will have her husband drive her home, she is agreeable not to drive if receiving oxycodone for treatment here.  Was previously taking pain medication that was helpful but has since run out  She denies fevers or chills.  She reports the pain in the left flank has been present for a month at least perhaps to now.  No nausea or vomiting.  He reports a sense of urge to urinate but no abnormal odor no fevers no chills and does not burn when she urinates.  She advising she is having to urinate in smaller quantities more frequently for the last couple days     Physical Exam   Triage Vital Signs: ED Triage Vitals  Encounter Vitals Group     BP 10/31/23 0947 112/75     Systolic BP Percentile --      Diastolic BP Percentile --      Pulse Rate 10/31/23 0947 89     Resp 10/31/23 0947 16     Temp 10/31/23 0947 97.8 F (36.6 C)     Temp Source 10/31/23 0947 Oral     SpO2 10/31/23 0947 94 %     Weight 10/31/23 0945 200 lb (90.7 kg)     Height 10/31/23 0945 5\' 3"  (1.6 m)      Head Circumference --      Peak Flow --      Pain Score 10/31/23 0945 7     Pain Loc --      Pain Education --      Exclude from Growth Chart --     Most recent vital signs: Vitals:   10/31/23 0947  BP: 112/75  Pulse: 89  Resp: 16  Temp: 97.8 F (36.6 C)  SpO2: 94%     General: Awake, no distress.  Sitting upright in no distress.  Holding left hand over the flank though and does appear to have at least mild left flank pain. CV:  Good peripheral perfusion.  Resp:  Normal effort.  Abd:  No distention.  Soft nontender nondistended.  Other:  Able to ambulate without difficulty.   ED Results / Procedures / Treatments   Labs (all labs ordered are listed, but only abnormal results are displayed) Labs Reviewed  URINALYSIS, ROUTINE W REFLEX MICROSCOPIC - Abnormal; Notable for the following components:      Result Value   Color, Urine AMBER (*)    APPearance CLOUDY (*)    Hgb urine dipstick MODERATE (*)  Ketones, ur 5 (*)    Protein, ur 100 (*)    Nitrite POSITIVE (*)    Leukocytes,Ua MODERATE (*)    Bacteria, UA MANY (*)    All other components within normal limits  BASIC METABOLIC PANEL - Abnormal; Notable for the following components:   Glucose, Bld 103 (*)    All other components within normal limits  CBC - Abnormal; Notable for the following components:   WBC 12.6 (*)    All other components within normal limits  URINALYSIS, ROUTINE W REFLEX MICROSCOPIC - Abnormal; Notable for the following components:   Color, Urine AMBER (*)    APPearance CLOUDY (*)    Hgb urine dipstick SMALL (*)    Ketones, ur 5 (*)    Protein, ur 100 (*)    Leukocytes,Ua MODERATE (*)    Bacteria, UA FEW (*)    All other components within normal limits  URINE CULTURE  POC URINE PREG, ED   Abstinent mild leukocytosis.  She is not febrile she is not tachycardic.  She does not meet SIRS or sepsis criteria.  Urinalysis is contaminated but concerning for possible  infection.  EKG     RADIOLOGY  Discussed with her urologist, he knows patient well, and advises no recommendation for additional imaging at this time   Reassuring examination.  Condition is previously known to be caused by left-sided kidney stone.  Is not have any peritonitis or signs or symptoms that would suggest acute or catastrophic intra-abdominal abnormality   PROCEDURES:  Critical Care performed: No  Procedures   MEDICATIONS ORDERED IN ED: Medications  oxyCODONE (Oxy IR/ROXICODONE) immediate release tablet 5 mg (5 mg Oral Given 10/31/23 1120)  sulfamethoxazole-trimethoprim (BACTRIM DS) 800-160 MG per tablet 1 tablet (1 tablet Oral Given 10/31/23 1120)     IMPRESSION / MDM / ASSESSMENT AND PLAN / ED COURSE  I reviewed the triage vital signs and the nursing notes.                              Differential diagnosis includes, but is not limited to, nephrolithiasis, urinary tract infection, pyelonephritis, septic stone, etc.  No clinical findings that would suggest acute bowel causation.  She has no nausea vomiting or diarrhea.  Abdominal exam very reassuring.  Left flank pain has been persistent for over a month now with etiology suspect to be left kidney stone  Will give patient pain relief with oxycodone here.  She is agreeable not to drive today she advised that she will not drive she will have her husband come pick her up.  Patient's presentation is most consistent with acute complicated illness / injury requiring diagnostic workup.      Clinical Course as of 10/31/23 1206  Wed Oct 31, 2023  1116  Case discussed with urology Dr. Wyatt Mage.  He advises that patient has had 3 previous scheduled attempts to have operative management.  He will have his scheduler call her today to set her up hopefully for an appointment this Friday.  The patient advises that she does not have an option to stay in the ED or in the hospital.  She must attend a court date today at 2 PM and  is insistent that she cannot stay for additional treatment [MQ]    Clinical Course User Index [MQ] Sharyn Creamer, MD   ----------------------------------------- 12:05 PM on 10/31/2023 ----------------------------------------- Patient resting comfortably.  Advises her husband will be here in about  20 minutes to pick her up.  She has a court date today.  I discussed extremely careful return precautions and strongly recommended that she call the urology clinic today.  Urology scheduling to call her.  She understands that she must follow-up closely, she understands very careful return precautions.  We discussed my concern that she could be developing an infection which can lead to a life-threatening condition or "sepsis" which she reports she has done some reading about recently.  The patient cannot stay for further treatment or workup, she advises she must attend his court date.  Wrongly encouraged her to return right away if any worsening of her condition.  She will start Bactrim prescription and intends to call urology today  Return precautions and treatment recommendations and follow-up discussed with the patient who is agreeable with the plan.   FINAL CLINICAL IMPRESSION(S) / ED DIAGNOSES   Final diagnoses:  Kidney stone on left side  Urinary tract infection, acute     Rx / DC Orders   ED Discharge Orders          Ordered    sulfamethoxazole-trimethoprim (BACTRIM DS) 800-160 MG tablet  2 times daily        10/31/23 1205             Note:  This document was prepared using Dragon voice recognition software and may include unintentional dictation errors.   Sharyn Creamer, MD 10/31/23 [phone]

## 2023-10-31 NOTE — Discharge Instructions (Signed)
No driving today.  Please use your antibiotic as prescribed.  Return to the emergency room right away if you develop a fever severe body aches, chills, weakness, or feel that you are in any worsening condition.  It is extremely important you call the urology office today to get scheduled for your appointment Friday. Please make certain to call today.

## 2023-10-31 NOTE — ED Notes (Signed)
Pt to ED with lower back pain pt describes as "feeling like I could piss buckets but really can't and it makes my back hurt. Breathing even, unlabored at this time. A&O x4.

## 2023-10-31 NOTE — ED Triage Notes (Signed)
Pt via POV from home. Pt c/o L side flank pain for the past month, reports that she was seen here 3 weeks ago and was suppose to follow up with Urology for stent placement but has not been able to go. States since last night she has had urinary urgency. Pt is A&Ox4 and NAD

## 2023-11-02 ENCOUNTER — Ambulatory Visit
Admission: RE | Admit: 2023-11-02 | Discharge: 2023-11-02 | Disposition: A | Discharge status: Home / Self Care | Payer: MEDICAID | Attending: Urology | Admitting: Urology

## 2023-11-02 ENCOUNTER — Ambulatory Visit: Payer: MEDICAID

## 2023-11-02 ENCOUNTER — Other Ambulatory Visit: Payer: Self-pay

## 2023-11-02 ENCOUNTER — Encounter: Payer: Self-pay | Admitting: Urology

## 2023-11-02 ENCOUNTER — Ambulatory Visit: Payer: MEDICAID | Admitting: Anesthesiology

## 2023-11-02 ENCOUNTER — Encounter
Admission: RE | Disposition: A | Discharge status: Home / Self Care | Payer: Self-pay | Source: Home / Self Care | Attending: Urology

## 2023-11-02 DIAGNOSIS — N201 Calculus of ureter: Secondary | ICD-10-CM | POA: Insufficient documentation

## 2023-11-02 HISTORY — PX: CYSTOSCOPY W/ RETROGRADES: SHX1426

## 2023-11-02 HISTORY — PX: CYSTOSCOPY/URETEROSCOPY/HOLMIUM LASER/STENT PLACEMENT: SHX6546

## 2023-11-02 LAB — POCT PREGNANCY, URINE: Preg Test, Ur: NEGATIVE

## 2023-11-02 SURGERY — CYSTOSCOPY/URETEROSCOPY/HOLMIUM LASER/STENT PLACEMENT
Anesthesia: General | Laterality: Left

## 2023-11-02 MED ORDER — ONDANSETRON HCL 4 MG/2ML IJ SOLN
INTRAMUSCULAR | Status: DC | PRN
  Administered 2023-11-02: 4 mg via INTRAVENOUS

## 2023-11-02 MED ORDER — FENTANYL CITRATE (PF) 100 MCG/2ML IJ SOLN
INTRAMUSCULAR | Status: DC | PRN
  Administered 2023-11-02: 100 ug via INTRAVENOUS

## 2023-11-02 MED ORDER — IOHEXOL 180 MG/ML  SOLN
INTRAMUSCULAR | Status: DC | PRN
  Administered 2023-11-02: 10 mL

## 2023-11-02 MED ORDER — OXYCODONE HCL 5 MG PO TABS: 5.0000 mg | ORAL_TABLET | Freq: Three times a day (TID) | ORAL | 0 refills | Status: AC | PRN

## 2023-11-02 MED ORDER — IPRATROPIUM-ALBUTEROL 0.5-2.5 (3) MG/3ML IN SOLN
3.0000 mL | Freq: Once | RESPIRATORY_TRACT | Status: AC
  Administered 2023-11-02: 3 mL via RESPIRATORY_TRACT

## 2023-11-02 MED ORDER — KETOROLAC TROMETHAMINE 30 MG/ML IJ SOLN
INTRAMUSCULAR | Status: DC | PRN
  Administered 2023-11-02: 15 mg via INTRAVENOUS

## 2023-11-02 MED ORDER — CIPROFLOXACIN IN D5W 400 MG/200ML IV SOLN
400.0000 mg | INTRAVENOUS | Status: AC
  Administered 2023-11-02: 200 mg via INTRAVENOUS

## 2023-11-02 MED ORDER — CIPROFLOXACIN IN D5W 400 MG/200ML IV SOLN
INTRAVENOUS | Status: AC
  Filled 2023-11-02: qty 200

## 2023-11-02 MED ORDER — PROPOFOL 10 MG/ML IV BOLUS
INTRAVENOUS | Status: DC | PRN
  Administered 2023-11-02: 200 mg via INTRAVENOUS

## 2023-11-02 MED ORDER — FENTANYL CITRATE (PF) 100 MCG/2ML IJ SOLN
INTRAMUSCULAR | Status: AC
  Filled 2023-11-02: qty 2

## 2023-11-02 MED ORDER — FENTANYL CITRATE (PF) 100 MCG/2ML IJ SOLN: 25.0000 ug | INTRAMUSCULAR | Status: DC | PRN

## 2023-11-02 MED ORDER — OXYCODONE HCL 5 MG PO TABS: 5.0000 mg | ORAL_TABLET | Freq: Once | ORAL | Status: DC | PRN

## 2023-11-02 MED ORDER — ROCURONIUM BROMIDE 100 MG/10ML IV SOLN
INTRAVENOUS | Status: DC | PRN
  Administered 2023-11-02: 50 mg via INTRAVENOUS

## 2023-11-02 MED ORDER — DEXAMETHASONE SODIUM PHOSPHATE 10 MG/ML IJ SOLN
INTRAMUSCULAR | Status: DC | PRN
  Administered 2023-11-02: 10 mg via INTRAVENOUS

## 2023-11-02 MED ORDER — IPRATROPIUM-ALBUTEROL 0.5-2.5 (3) MG/3ML IN SOLN
RESPIRATORY_TRACT | Status: AC
  Filled 2023-11-02: qty 3

## 2023-11-02 MED ORDER — LACTATED RINGERS IV SOLN: INTRAVENOUS | Status: DC | PRN

## 2023-11-02 MED ORDER — DEXMEDETOMIDINE HCL IN NACL 80 MCG/20ML IV SOLN
INTRAVENOUS | Status: DC | PRN
  Administered 2023-11-02: 8 ug via INTRAVENOUS
  Administered 2023-11-02: 12 ug via INTRAVENOUS

## 2023-11-02 MED ORDER — DROPERIDOL 2.5 MG/ML IJ SOLN: 0.6250 mg | Freq: Once | INTRAMUSCULAR | Status: DC | PRN

## 2023-11-02 MED ORDER — OXYCODONE HCL 5 MG/5ML PO SOLN: 5.0000 mg | Freq: Once | ORAL | Status: DC | PRN

## 2023-11-02 MED ORDER — SULFAMETHOXAZOLE-TRIMETHOPRIM 800-160 MG PO TABS: 1.0000 | ORAL_TABLET | Freq: Two times a day (BID) | ORAL | 0 refills | Status: AC

## 2023-11-02 MED ORDER — SUGAMMADEX SODIUM 200 MG/2ML IV SOLN
INTRAVENOUS | Status: DC | PRN
  Administered 2023-11-02: 200 mg via INTRAVENOUS

## 2023-11-02 MED ORDER — MIDAZOLAM HCL 2 MG/2ML IJ SOLN
INTRAMUSCULAR | Status: AC
Start: 2023-11-02 — End: ?
  Filled 2023-11-02: qty 2

## 2023-11-02 MED ORDER — LIDOCAINE HCL (CARDIAC) PF 100 MG/5ML IV SOSY
PREFILLED_SYRINGE | INTRAVENOUS | Status: DC | PRN
  Administered 2023-11-02: 100 mg via INTRAVENOUS

## 2023-11-02 MED ORDER — ACETAMINOPHEN 10 MG/ML IV SOLN: 1000.0000 mg | Freq: Once | INTRAVENOUS | Status: DC | PRN

## 2023-11-02 MED ORDER — MIDAZOLAM HCL 2 MG/2ML IJ SOLN
INTRAMUSCULAR | Status: DC | PRN
  Administered 2023-11-02: 2 mg via INTRAVENOUS

## 2023-11-02 SURGICAL SUPPLY — 29 items
ADHESIVE MASTISOL STRL (MISCELLANEOUS) IMPLANT
BAG DRAIN SIEMENS DORNER NS (MISCELLANEOUS) ×2 IMPLANT
BAG PRESSURE INF REUSE 3000 (BAG) ×2 IMPLANT
BRUSH SCRUB EZ 1% IODOPHOR (MISCELLANEOUS) ×2 IMPLANT
CATH URET FLEX-TIP 2 LUMEN 10F (CATHETERS) IMPLANT
CATH URETL OPEN 5X70 (CATHETERS) IMPLANT
CNTNR URN SCR LID CUP LEK RST (MISCELLANEOUS) IMPLANT
CONT SPEC 4OZ STRL OR WHT (MISCELLANEOUS)
DRAPE UTILITY 15X26 TOWEL STRL (DRAPES) ×2 IMPLANT
DRSG TEGADERM 2-3/8X2-3/4 SM (GAUZE/BANDAGES/DRESSINGS) IMPLANT
FIBER LASER MOSES 200 DFL (Laser) IMPLANT
FIBER LASER MOSES 365 DFL (Laser) IMPLANT
GLOVE BIOGEL PI IND STRL 7.5 (GLOVE) ×2 IMPLANT
GOWN STRL REUS W/ TWL LRG LVL3 (GOWN DISPOSABLE) ×2 IMPLANT
GOWN STRL REUS W/ TWL XL LVL3 (GOWN DISPOSABLE) ×2 IMPLANT
GOWN STRL REUS W/TWL LRG LVL3 (GOWN DISPOSABLE) ×2
GOWN STRL REUS W/TWL XL LVL3 (GOWN DISPOSABLE) ×2
GUIDEWIRE STR DUAL SENSOR (WIRE) ×2 IMPLANT
IV NS IRRIG 3000ML ARTHROMATIC (IV SOLUTION) ×2 IMPLANT
KIT TURNOVER CYSTO (KITS) ×2 IMPLANT
PACK CYSTO AR (MISCELLANEOUS) ×2 IMPLANT
SET CYSTO W/LG BORE CLAMP LF (SET/KITS/TRAYS/PACK) ×2 IMPLANT
SHEATH NAVIGATOR HD 12/14X36 (SHEATH) IMPLANT
STENT URET 6FRX24 CONTOUR (STENTS) IMPLANT
STENT URET 6FRX26 CONTOUR (STENTS) IMPLANT
SURGILUBE 2OZ TUBE FLIPTOP (MISCELLANEOUS) ×2 IMPLANT
SYR 10ML LL (SYRINGE) ×2 IMPLANT
VALVE UROSEAL ADJ ENDO (VALVE) IMPLANT
WATER STERILE IRR 500ML POUR (IV SOLUTION) ×2 IMPLANT

## 2023-11-02 NOTE — H&P (Signed)
11/02/23 11:56 AM   Vanessa Rivera [birth year] [id number]  CC: Left ureteral stone  HPI: 37 year old female who I originally saw in the ER on 09/14/2023 for a 5 mm left proximal ureteral stone and concern for UTI/potential sepsis from urinary source.  I recommended urgent stent placement, but she refused and left AGAINST MEDICAL ADVICE.  She ultimately presented to clinic on 10/03/2023 with worsening left-sided flank pain, and I recommended left ureteroscopy, laser lithotripsy and stent placement.  She no showed 2 scheduled surgery dates.  She presented again to the ER on 10/31/2023 with left-sided flank pain, no imaging was performed, urinalysis appeared infected, and she again deferred stent placement.  She was started on Bactrim, and added to the OR schedule for this week.   PMH: Past Medical History:  Diagnosis Date   Amphetamine use disorder, moderate (HCC)    Anemia    Anxiety and depression    Fetal tachycardia affecting care of mother, delivered 04/07/2019   Goiter    Heart murmur    at birth   Hypothyroidism    Kidney stone    Left ureteral stone 09/2023   Migraine with aura 01/05/2005   Migraine with aura 01/05/2005   Normal vaginal delivery 04/07/2019   Placental abruption affecting delivery 04/07/2019   Postpartum care following vaginal delivery 04/07/2019   Preterm premature rupture of membranes (PPROM) delivered, current hospitalization 04/07/2019   Tachycardia with greater than 160 beats per minute 04/07/2019   Thyroid disease    Thyroiditis    Tobacco use affecting pregnancy in third trimester, antepartum    Vaginal bleeding during pregnancy 04/06/2019    Surgical History: History reviewed. No pertinent surgical history.   Family History: Family History  Problem Relation Age of Onset   Thyroid disease Mother    Bipolar disorder Mother    Congenital heart disease Father    Prostate cancer Maternal Grandfather    Thyroid disease Maternal Aunt      Social History:  reports that she has been smoking cigarettes. She has a 3.5 pack-year smoking history. She has never used smokeless tobacco. She reports that she does not currently use alcohol. She reports that she does not currently use drugs after having used the following drugs: Methamphetamines.  Physical Exam: BP 110/71   Pulse 76   Temp 98.2 F (36.8 C) (Oral)   Resp 16   Ht 5\' 3"  (1.6 m)   Wt 90.7 kg   LMP 10/22/2023 (Approximate) Comment: denies preg, signed preg waiver  SpO2 97%   BMI 35.43 kg/m    Constitutional:  Alert and oriented, No acute distress. Cardiovascular: Regular rate and rhythm. Respiratory: Clear to auscultation bilaterally GI: Abdomen is soft, nontender, nondistended, no abdominal masses   Laboratory Data: Urine culture 10/31/2023 with >100k gram-negative rods, has been on Bactrim  Assessment & Plan:   37 year old female with 5 mm left proximal ureteral stone and intermittent infections, she has left AGAINST MEDICAL ADVICE from the ER multiple times, and no showed 2 scheduled surgery dates for ureteroscopy.  I had a very frank conversation again with the patient today that this may require stent placement and delayed management if any evidence of infection intraoperatively.  Unfortunately, high concern for her being lost to follow-up with an indwelling stent based on her behavior over the last 2 months with the stone in place.  We may have to accept a higher risk of infection, and she has been on Bactrim for more than 48 hours.  We  specifically discussed the risks ureteroscopy including bleeding, infection/sepsis, stent related symptoms including flank pain/urgency/frequency/incontinence/dysuria, ureteral injury, ureteral stricture, inability to access stone, or need for staged or additional procedures.  Left ureteroscopy, laser lithotripsy, stent placement today   Legrand Rams, MD 11/02/2023  [facility] [address] [place], [place] [address] [phone]

## 2023-11-02 NOTE — Transfer of Care (Signed)
Immediate Anesthesia Transfer of Care Note  Patient: Vanessa Rivera  Procedure(s) Performed: CYSTOSCOPY/URETEROSCOPY/HOLMIUM LASER/STENT PLACEMENT (Left)  Patient Location: PACU  Anesthesia Type:General  Level of Consciousness: awake, alert , and oriented  Airway & Oxygen Therapy: Patient Spontanous Breathing  Post-op Assessment: Report given to RN and Post -op Vital signs reviewed and stable  Post vital signs: stable  Last Vitals:  Vitals Value Taken Time  BP 105/70 11/02/23 1355  Temp    Pulse 74 11/02/23 1357  Resp 24 11/02/23 1357  SpO2 100 % 11/02/23 1357  Vitals shown include unfiled device data.  Last Pain:  Vitals:   11/02/23 1049  TempSrc: Oral  PainSc: 4          Complications: No notable events documented.

## 2023-11-02 NOTE — Anesthesia Postprocedure Evaluation (Signed)
Anesthesia Post Note  Patient: Vanessa Rivera  Procedure(s) Performed: CYSTOSCOPY/URETEROSCOPY/HOLMIUM LASER/STENT PLACEMENT (Left)  Patient location during evaluation: PACU Anesthesia Type: General Level of consciousness: awake and alert Pain management: pain level controlled Vital Signs Assessment: post-procedure vital signs reviewed and stable Respiratory status: spontaneous breathing, nonlabored ventilation, respiratory function stable and patient connected to nasal cannula oxygen Cardiovascular status: blood pressure returned to baseline and stable Postop Assessment: no apparent nausea or vomiting Anesthetic complications: no  There were no known notable events for this encounter.   Last Vitals:  Vitals:   11/02/23 1356 11/02/23 1400  BP: 105/70 113/67  Pulse: 79 73  Resp: 19 (!) 22  Temp: 36.9 C   SpO2: 100% 99%    Last Pain:  Vitals:   11/02/23 1356  TempSrc:   PainSc: Asleep                 Stephanie Coup

## 2023-11-02 NOTE — Op Note (Signed)
Date of procedure: 11/02/23  Preoperative diagnosis:  Left proximal ureteral stone  Postoperative diagnosis:  Same  Procedure: Cystoscopy, left ureteroscopy, laser lithotripsy, left retrograde pyelogram with intraoperative interpretation, left ureteral stent placement  Surgeon: Legrand Rams, MD  Anesthesia: General  Complications: None  Intraoperative findings:  1.  Normal cystoscopy, left proximal stone pushed back into the kidney with the wire, dusted with laser, stent placed  EBL: Minimal  Specimens: None  Drains: Left 6 French by 24 cm ureteral stent with Dangler  Indication: Vanessa Rivera is a 37 y.o. patient with 4 mm left proximal ureteral stone who has had multiple ER visits and left AMA despite recommendations for admission and stent placement in setting of possible infection.  She also no showed multiple outpatient procedures.  She has continued to have left-sided flank pain secondary to a 4 mm proximal ureteral stone.  Most recent urinalysis appeared infected, and she was started on Bactrim.  We discussed possible need for temporary stent placement if any evidence of intraoperative infection, however high concern she would be lost to follow-up based on her multiple no-shows and leaving AMA from the ER over the last 6 weeks.  After reviewing the management options for treatment, they elected to proceed with the above surgical procedure(s). We have discussed the potential benefits and risks of the procedure, side effects of the proposed treatment, the likelihood of the patient achieving the goals of the procedure, and any potential problems that might occur during the procedure or recuperation. Informed consent has been obtained.  Description of procedure:  The patient was taken to the operating room and general anesthesia was induced. SCDs were placed for DVT prophylaxis. The patient was placed in the dorsal lithotomy position, prepped and draped in the usual sterile  fashion, and preoperative antibiotics(Cipro) were administered. A preoperative time-out was performed.   A 21 French rigid cystoscope was used to intubate the urethra and thorough cystoscopy was performed.  The bladder was grossly normal.  A sensor wire advanced easily up to the left kidney and there appeared to be a stone in the proximal ureter that pushed back into the kidney with wire placement.  A semirigid long ureteroscope was advanced alongside the wire up to the proximal ureter no stone was identified.  A digital single-channel flexible ureteroscope was then advanced over the wire up to the kidney.  Thorough pyeloscopy showed a 4 mm stone in the renal pelvis.  This was fragmented with a 200 m laser fiber on settings of 0.5 J and 80 Hz.  All fragments were smaller than the laser fiber.  Thorough pyeloscopy showed no other stones.  A retrograde pyelogram was performed from the proximal ureter and showed no extravasation or filling defects.  Pullback ureteroscopy showed no stones or ureteral injury.  A wire was replaced through the scope back into the kidney and the ureteroscope removed.  A 6 French by 24 cm ureteral stent was placed fluoroscopically with a curl in the upper pole as well as in the bladder.  The bladder was drained.  The Dangler of the stent was secured to the suprapubic region with Mastisol and Tegaderm.  Disposition: Stable to PACU  Plan: Can remove stent at home on Wednesday morning Continue Bactrim DS twice daily x 7 days total  Legrand Rams, MD

## 2023-11-02 NOTE — Anesthesia Preprocedure Evaluation (Addendum)
Anesthesia Evaluation  Patient identified by MRN, date of birth, ID band Patient awake    Reviewed: Allergy & Precautions, H&P , NPO status , Patient's Chart, lab work & pertinent test results  Airway Mallampati: IV  TM Distance: >3 FB Neck ROM: full    Dental  (+) Missing   Pulmonary shortness of breath and with exertion, Current SmokerPatient did not abstain from smoking. Cough    + wheezing      Cardiovascular negative cardio ROS Normal cardiovascular exam     Neuro/Psych  PSYCHIATRIC DISORDERS Anxiety Depression    negative neurological ROS     GI/Hepatic negative GI ROS, Neg liver ROS,,,  Endo/Other  Hypothyroidism    Renal/GU  history of left-sided nephrolithiasis     Musculoskeletal   Abdominal   Peds  Hematology negative hematology ROS (+)   Anesthesia Other Findings Past Medical History: No date: Amphetamine use disorder, moderate (HCC) No date: Anemia No date: Anxiety and depression 04/07/2019: Fetal tachycardia affecting care of mother, delivered No date: Goiter No date: Heart murmur     Comment:  at birth No date: Hypothyroidism No date: Kidney stone 09/2023: Left ureteral stone 01/05/2005: Migraine with aura 01/05/2005: Migraine with aura 04/07/2019: Normal vaginal delivery 04/07/2019: Placental abruption affecting delivery 04/07/2019: Postpartum care following vaginal delivery 04/07/2019: Preterm premature rupture of membranes (PPROM) delivered,  current hospitalization 04/07/2019: Tachycardia with greater than 160 beats per minute No date: Thyroid disease No date: Thyroiditis No date: Tobacco use affecting pregnancy in third trimester,  antepartum 04/06/2019: Vaginal bleeding during pregnancy   Reproductive/Obstetrics negative OB ROS                             Anesthesia Physical Anesthesia Plan  ASA: 3  Anesthesia Plan: General ETT   Post-op Pain  Management: Ofirmev IV (intra-op)* and Precedex   Induction: Intravenous  PONV Risk Score and Plan: 2 and Ondansetron, Dexamethasone and Midazolam  Airway Management Planned: Oral ETT  Additional Equipment:   Intra-op Plan:   Post-operative Plan: Extubation in OR  Informed Consent: I have reviewed the patients History and Physical, chart, labs and discussed the procedure including the risks, benefits and alternatives for the proposed anesthesia with the patient or authorized representative who has indicated his/her understanding and acceptance.     Dental Advisory Given  Plan Discussed with: CRNA and Surgeon  Anesthesia Plan Comments:         Anesthesia Quick Evaluation

## 2023-11-02 NOTE — Anesthesia Procedure Notes (Signed)
Procedure Name: Intubation Date/Time: 11/02/2023 1:28 PM  Performed by: Maryla Morrow., CRNAPre-anesthesia Checklist: Patient identified, Patient being monitored, Timeout performed, Emergency Drugs available and Suction available Patient Re-evaluated:Patient Re-evaluated prior to induction Oxygen Delivery Method: Circle system utilized Preoxygenation: Pre-oxygenation with 100% oxygen Induction Type: IV induction Ventilation: Mask ventilation without difficulty Laryngoscope Size: Mac and 3 Grade View: Grade I Tube type: Oral Tube size: 6.5 mm Number of attempts: 1 Airway Equipment and Method: Stylet Placement Confirmation: ETT inserted through vocal cords under direct vision, positive ETCO2 and breath sounds checked- equal and bilateral Secured at: 19 cm Tube secured with: Tape Dental Injury: Teeth and Oropharynx as per pre-operative assessment

## 2023-11-03 ENCOUNTER — Encounter: Payer: Self-pay | Admitting: Urology

## 2023-11-03 LAB — URINE CULTURE: Culture: >=100000 — AB

## 2023-11-21 ENCOUNTER — Other Ambulatory Visit: Payer: Self-pay

## 2023-11-21 ENCOUNTER — Emergency Department
Admission: EM | Admit: 2023-11-21 | Discharge: 2023-11-21 | Disposition: A | Discharge status: Home / Self Care | Payer: MEDICAID | Attending: Student in an Organized Health Care Education/Training Program | Admitting: Student in an Organized Health Care Education/Training Program

## 2023-11-21 DIAGNOSIS — R0981 Nasal congestion: Secondary | ICD-10-CM | POA: Diagnosis not present

## 2023-11-21 DIAGNOSIS — Z20822 Contact with and (suspected) exposure to covid-19: Secondary | ICD-10-CM | POA: Insufficient documentation

## 2023-11-21 DIAGNOSIS — F17200 Nicotine dependence, unspecified, uncomplicated: Secondary | ICD-10-CM | POA: Diagnosis not present

## 2023-11-21 DIAGNOSIS — R051 Acute cough: Secondary | ICD-10-CM | POA: Diagnosis not present

## 2023-11-21 DIAGNOSIS — R059 Cough, unspecified: Secondary | ICD-10-CM | POA: Diagnosis present

## 2023-11-21 LAB — SARS CORONAVIRUS 2 BY RT PCR: SARS Coronavirus 2 by RT PCR: NEGATIVE

## 2023-11-21 MED ORDER — ALBUTEROL SULFATE HFA 108 (90 BASE) MCG/ACT IN AERS: 2.0000 | INHALATION_SPRAY | Freq: Four times a day (QID) | RESPIRATORY_TRACT | 2 refills | Status: AC | PRN

## 2023-11-21 MED ORDER — AZITHROMYCIN 250 MG PO TABS: ORAL_TABLET | ORAL | 0 refills | Status: AC

## 2023-11-21 MED ORDER — PREDNISONE 20 MG PO TABS: 40.0000 mg | ORAL_TABLET | Freq: Every day | ORAL | 0 refills | Status: AC

## 2023-11-21 NOTE — ED Provider Notes (Signed)
[facility] Provider Note    Event Date/Time   First MD Initiated Contact with Patient 11/21/23 [phone]     (approximate)   History   No chief complaint on file.   HPI  Vanessa Rivera is a 37 y.o. female Modena Jansky to the ER for 4 to 5 days of cough congestion.  Cough is particularly bad at night.  Keeping her and her husband awake.  No known sick contacts.  Has been diagnosed with bronchitis in the past.  She does smoke.  Denies any history of asthma or COPD.  No nausea or vomiting.  Has had some congestion.     Physical Exam   Triage Vital Signs: ED Triage Vitals  Encounter Vitals Group     BP 11/21/23 0724 104/71     Systolic BP Percentile --      Diastolic BP Percentile --      Pulse Rate 11/21/23 0724 86     Resp 11/21/23 0724 16     Temp 11/21/23 0724 97.9 F (36.6 C)     Temp Source 11/21/23 0724 Oral     SpO2 11/21/23 0724 100 %     Weight 11/21/23 0721 199 lb 15.3 oz (90.7 kg)     Height --      Head Circumference --      Peak Flow --      Pain Score 11/21/23 0721 5     Pain Loc --      Pain Education --      Exclude from Growth Chart --     Most recent vital signs: Vitals:   11/21/23 0724  BP: 104/71  Pulse: 86  Resp: 16  Temp: 97.9 F (36.6 C)  SpO2: 100%     Constitutional: Alert  Eyes: Conjunctivae are normal.  Head: Atraumatic. Nose: No congestion/rhinnorhea. Mouth/Throat: Mucous membranes are moist.   Neck: Painless ROM.  Cardiovascular:   Good peripheral circulation. Respiratory: Normal respiratory effort.  No retractions.  Coarse breath sounds throughout no rhonchi or wheeze.  Patient with bronchitic sounding cough. Gastrointestinal: Soft and nontender.  Musculoskeletal:  no deformity Neurologic:  MAE spontaneously. No gross focal neurologic deficits are appreciated.  Skin:  Skin is warm, dry and intact. No rash noted. Psychiatric: Mood and affect are normal. Speech and behavior are normal.    ED Results /  Procedures / Treatments   Labs (all labs ordered are listed, but only abnormal results are displayed) Labs Reviewed  SARS CORONAVIRUS 2 BY RT PCR     EKG     RADIOLOGY    PROCEDURES:  Critical Care performed:   Procedures   MEDICATIONS ORDERED IN ED: Medications - No data to display   IMPRESSION / MDM / ASSESSMENT AND PLAN / ED COURSE  I reviewed the triage vital signs and the nursing notes.                              Differential diagnosis includes, but is not limited to, URI, COVID, flu, pneumonia, bronchitis, asthma  Patient presented to the ER for evaluation of symptoms as described above.  She is clinically very well-appearing afebrile hemodynamically stable no hypoxia.  Has a bronchitic sounding cough.  Viral panel sent for the blood differential.  Given her exam presentation we will prescribe inhaler as well as short prednisone burst.  Discussed use of Z-Pak if not improved in 24 to 48 hours given recent  increase in mycoplasma pneumonia locally..  Patient agreeable to plan.       FINAL CLINICAL IMPRESSION(S) / ED DIAGNOSES   Final diagnoses:  Acute cough     Rx / DC Orders   ED Discharge Orders          Ordered    predniSONE (DELTASONE) 20 MG tablet  Daily        11/21/23 0748    albuterol (VENTOLIN HFA) 108 (90 Base) MCG/ACT inhaler  Every 6 hours PRN        11/21/23 0748    azithromycin (ZITHROMAX Z-PAK) 250 MG tablet        11/21/23 4540             Note:  This document was prepared using Dragon voice recognition software and may include unintentional dictation errors.    Willy Eddy, MD 11/21/23 [phone]

## 2023-11-21 NOTE — ED Triage Notes (Signed)
C/O cough and sinus congestions, body aches x 4 days.

## 2023-12-22 ENCOUNTER — Emergency Department: Payer: MEDICAID

## 2023-12-22 ENCOUNTER — Emergency Department
Admission: EM | Admit: 2023-12-22 | Discharge: 2023-12-22 | Disposition: A | Discharge status: Home / Self Care | Payer: MEDICAID | Attending: Emergency Medicine | Admitting: Emergency Medicine

## 2023-12-22 ENCOUNTER — Other Ambulatory Visit: Payer: Self-pay

## 2023-12-22 DIAGNOSIS — M25532 Pain in left wrist: Secondary | ICD-10-CM | POA: Diagnosis present

## 2023-12-22 MED ORDER — MELOXICAM 15 MG PO TABS: 15.0000 mg | ORAL_TABLET | Freq: Every day | ORAL | 0 refills | Status: AC

## 2023-12-22 NOTE — ED Triage Notes (Addendum)
 Pt states wrist pain for 2-3 months, pt states seen at [facility] given pain meds, pt states pain is still there. Pt can move fingers but endorses pain. NAD noted. Pt DX'ed with tendonitis. Wants repeat XRAY.

## 2023-12-22 NOTE — ED Provider Notes (Signed)
 [facility] Provider Note   None    (approximate) History  Wrist Pain  HPI Vanessa Rivera is a 38 y.o. female who presents for left wrist pain has been present over the last few months.  Patient states that this pain radiates into the left thumb and is worse with trying to grip something or moving his thumb.  Patient also endorses tenderness to palpation over the anterior aspect of this left thumb. ROS: Patient currently denies any vision changes, tinnitus, difficulty speaking, facial droop, sore throat, chest pain, shortness of breath, abdominal pain, nausea/vomiting/diarrhea, dysuria, or weakness/numbness/paresthesias in any extremity   Physical Exam  Triage Vital Signs: ED Triage Vitals [12/22/23 1250]  Encounter Vitals Group     BP (!) 131/93     Systolic BP Percentile      Diastolic BP Percentile      Pulse Rate 72     Resp 18     Temp 98.2 F (36.8 C)     Temp Source Oral     SpO2 100 %     Weight 180 lb (81.6 kg)     Height 5' 3 (1.6 m)     Head Circumference      Peak Flow      Pain Score 6     Pain Loc      Pain Education      Exclude from Growth Chart    Most recent vital signs: Vitals:   12/22/23 1250  BP: (!) 131/93  Pulse: 72  Resp: 18  Temp: 98.2 F (36.8 C)  SpO2: 100%   General: Awake, oriented x4. CV:  Good peripheral perfusion.  Resp:  Normal effort.  Abd:  No distention.  Other:  Middle-aged obese Hispanic female resting comfortably in no acute distress.  Tenderness to palpation over the extensor tendon of the left thumb without snuffbox tenderness ED Results / Procedures / Treatments  Labs (all labs ordered are listed, but only abnormal results are displayed) Labs Reviewed - No data to display RADIOLOGY ED MD interpretation: X-ray of the left wrist independently interpreted and shows no acute osseous abnormalities -Agree with radiology assessment Official radiology report(s): DG Wrist Complete Left Result Date:  12/22/2023 CLINICAL DATA:  Pain for 2-3 months.  No history of trauma EXAM: LEFT WRIST - COMPLETE 4 VIEW COMPARISON:  None Available. FINDINGS: There is no evidence of fracture or dislocation. There is no evidence of arthropathy or other focal bone abnormality. Soft tissues are unremarkable. If there is persistent pain or further concern follow up is recommended to assess for occult abnormality. IMPRESSION: No acute osseous abnormality. Electronically Signed   By: Ranell Bring M.D.   On: 12/22/2023 13:16   PROCEDURES: Critical Care performed: No Procedures MEDICATIONS ORDERED IN ED: Medications - No data to display IMPRESSION / MDM / ASSESSMENT AND PLAN / ED COURSE  I reviewed the triage vital signs and the nursing notes.                             The patient is on the cardiac monitor to evaluate for evidence of arrhythmia and/or significant heart rate changes. Patient's presentation is most consistent with acute presentation with potential threat to life or bodily function. 38 year old female presents for chronic left wrist pain Given history, exam and workup I have low suspicion for fracture, dislocation, significant ligamentous injury, septic arthritis, gout flare, new autoimmune arthropathy, or gonococcal arthropathy.  Interventions: Left wrist x-ray shows no evidence of acute abnormalities  Left wrist thumb spica splint provided Disposition: Discharge home with strict return precautions and instructions for prompt primary care follow up in the next week.   FINAL CLINICAL IMPRESSION(S) / ED DIAGNOSES   Final diagnoses:  Left wrist pain   Rx / DC Orders   ED Discharge Orders          Ordered    meloxicam  (MOBIC ) 15 MG tablet  Daily        12/22/23 1456           Note:  This document was prepared using Dragon voice recognition software and may include unintentional dictation errors.   Tamla Winkels K, MD 12/22/23 [phone]

## 2024-02-01 ENCOUNTER — Emergency Department
Admission: EM | Admit: 2024-02-01 | Discharge: 2024-02-01 | Disposition: A | Discharge status: Home / Self Care | Payer: MEDICAID | Attending: Emergency Medicine | Admitting: Emergency Medicine

## 2024-02-01 ENCOUNTER — Other Ambulatory Visit: Payer: Self-pay

## 2024-02-01 ENCOUNTER — Encounter: Payer: Self-pay | Admitting: Emergency Medicine

## 2024-02-01 ENCOUNTER — Emergency Department: Payer: MEDICAID

## 2024-02-01 DIAGNOSIS — S29011A Strain of muscle and tendon of front wall of thorax, initial encounter: Secondary | ICD-10-CM | POA: Diagnosis not present

## 2024-02-01 DIAGNOSIS — I10 Essential (primary) hypertension: Secondary | ICD-10-CM | POA: Insufficient documentation

## 2024-02-01 DIAGNOSIS — E039 Hypothyroidism, unspecified: Secondary | ICD-10-CM | POA: Diagnosis not present

## 2024-02-01 DIAGNOSIS — S29019A Strain of muscle and tendon of unspecified wall of thorax, initial encounter: Secondary | ICD-10-CM

## 2024-02-01 DIAGNOSIS — X58XXXA Exposure to other specified factors, initial encounter: Secondary | ICD-10-CM | POA: Insufficient documentation

## 2024-02-01 DIAGNOSIS — M546 Pain in thoracic spine: Secondary | ICD-10-CM | POA: Diagnosis present

## 2024-02-01 MED ORDER — LIDOCAINE 5 % EX PTCH
1.0000 | MEDICATED_PATCH | CUTANEOUS | Status: DC
  Administered 2024-02-01: 1 via TRANSDERMAL
  Filled 2024-02-01: qty 1

## 2024-02-01 MED ORDER — LIDOCAINE 5 % EX PTCH: 1.0000 | MEDICATED_PATCH | Freq: Two times a day (BID) | CUTANEOUS | 0 refills | Status: AC

## 2024-02-01 MED ORDER — CYCLOBENZAPRINE HCL 5 MG PO TABS: 5.0000 mg | ORAL_TABLET | Freq: Three times a day (TID) | ORAL | 0 refills | Status: AC | PRN

## 2024-02-01 NOTE — ED Triage Notes (Signed)
C/O thoracic back pain x 1 week.  Also shoulder and upper back stiffness.  No known injury.

## 2024-02-01 NOTE — ED Provider Notes (Signed)
[facility] Provider Note    Event Date/Time   First MD Initiated Contact with Patient 02/01/24 0820     (approximate)   History   Chief Complaint Back Pain   HPI  Nery Kalisz is a 38 y.o. female with past medical history of hypertension, hyperthyroidism, and migraines who presents to the ED complaining of back pain.  Patient reports that she has had 1 week of gradually worsening pain in the middle of her upper back.  She describes it as a dull aching pain that is worse when she twists or lifts her arms up.  She denies any fevers or cough and has not had any pain in her chest or difficulty breathing.  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     Physical Exam   Triage Vital Signs: ED Triage Vitals  Encounter Vitals Group     BP 02/01/24 0724 114/79     Systolic BP Percentile --      Diastolic BP Percentile --      Pulse Rate 02/01/24 0724 84     Resp 02/01/24 0724 16     Temp 02/01/24 0724 98.3 F (36.8 C)     Temp Source 02/01/24 0724 Oral     SpO2 02/01/24 0724 96 %     Weight 02/01/24 0722 179 lb 14.3 oz (81.6 kg)     Height --      Head Circumference --      Peak Flow --      Pain Score 02/01/24 0721 7     Pain Loc --      Pain Education --      Exclude from Growth Chart --     Most recent vital signs: Vitals:   02/01/24 0724  BP: 114/79  Pulse: 84  Resp: 16  Temp: 98.3 F (36.8 C)  SpO2: 96%    Constitutional: Alert and oriented. Eyes: Conjunctivae are normal. Head: Atraumatic. Nose: No congestion/rhinnorhea. Mouth/Throat: Mucous membranes are moist.  Cardiovascular: Normal rate, regular rhythm. Grossly normal heart sounds.  2+ radial pulses bilaterally. Respiratory: Normal respiratory effort.  No retractions. Lungs CTAB. Gastrointestinal: Soft and nontender. No distention. Musculoskeletal: No lower extremity tenderness nor edema.  Midline thoracic spinal tenderness to  palpation as well as paraspinal tenderness. Neurologic:  Normal speech and language. No gross focal neurologic deficits are appreciated.    ED Results / Procedures / Treatments   Labs (all labs ordered are listed, but only abnormal results are displayed) Labs Reviewed - No data to display   RADIOLOGY Chest x-ray reviewed and interpreted by me with no bony abnormality, infiltrate, edema, or effusion noted.  PROCEDURES:  Critical Care performed: No  Procedures   MEDICATIONS ORDERED IN ED: Medications  lidocaine (LIDODERM) 5 % 1 patch (1 patch Transdermal Patch Applied 02/01/24 0921)     IMPRESSION / MDM / ASSESSMENT AND PLAN / ED COURSE  I reviewed the triage vital signs and the nursing notes.                              38 y.o. female with past medical history of hypertension, hypothyroidism, and migraines who presents to the ED complaining of pain in the middle as well as both sides of her upper back for the past week.  Patient's presentation is most consistent with acute complicated illness / injury requiring  diagnostic workup.  Differential diagnosis includes, but is not limited to, fracture, muscle strain, pneumonia, pneumothorax, ACS, PE.  Patient nontoxic-appearing and in no acute distress, vital signs are unremarkable.  Pain is reproducible with palpation of her midline thoracic spine as well as both paraspinal areas, suspect musculoskeletal etiology.  She is PERC negative and no chest pain raise suspicion for ACS.  Chest x-ray pending at this time, patient declines Toradol, Tylenol, or ibuprofen.  We will treat with Lidoderm patch and reassess following imaging.  Chest x-ray is unremarkable, patient appropriate for outpatient management for suspected thoracic muscle strain.  Will prescribe Flexeril and Lidoderm patches, patient counseled to return to the ED for new or worsening symptoms.  Patient agrees with plan.      FINAL CLINICAL IMPRESSION(S) / ED DIAGNOSES    Final diagnoses:  Thoracic myofascial strain, initial encounter     Rx / DC Orders   ED Discharge Orders          Ordered    cyclobenzaprine (FLEXERIL) 5 MG tablet  3 times daily PRN        02/01/24 1005    lidocaine (LIDODERM) 5 %  Every 12 hours        02/01/24 1005             Note:  This document was prepared using Dragon voice recognition software and may include unintentional dictation errors.   Chesley Noon, MD 02/01/24 1006

## 2024-02-16 ENCOUNTER — Emergency Department
Admission: EM | Admit: 2024-02-16 | Discharge: 2024-02-16 | Disposition: A | Discharge status: Home / Self Care | Payer: MEDICAID | Attending: Emergency Medicine | Admitting: Emergency Medicine

## 2024-02-16 ENCOUNTER — Other Ambulatory Visit: Payer: Self-pay

## 2024-02-16 ENCOUNTER — Emergency Department: Payer: MEDICAID

## 2024-02-16 DIAGNOSIS — G5602 Carpal tunnel syndrome, left upper limb: Secondary | ICD-10-CM | POA: Diagnosis not present

## 2024-02-16 DIAGNOSIS — I10 Essential (primary) hypertension: Secondary | ICD-10-CM | POA: Diagnosis not present

## 2024-02-16 DIAGNOSIS — M79642 Pain in left hand: Secondary | ICD-10-CM | POA: Diagnosis present

## 2024-02-16 MED ORDER — MELOXICAM 15 MG PO TABS: 15.0000 mg | ORAL_TABLET | Freq: Every day | ORAL | 0 refills | Status: AC

## 2024-02-16 NOTE — ED Provider Notes (Signed)
 [facility] Provider Note    Event Date/Time   First MD Initiated Contact with Patient 02/16/24 [phone]     (approximate)   History   Wrist Pain   HPI  Vanessa Rivera is a 38 y.o. female  with history of hyperthyroidism, anxiety, HTN, anemia and as listed in EMR presents to the emergency department for evaluation of left hand pain x 3-4 days. 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 She is experiencing a burning pain in the proximal thumb/wrist area with specific movements. No alleviating measures prior to arrival. No previous injury to left hand/wrist. Left hand dominant.      Physical Exam   Triage Vital Signs: ED Triage Vitals  Encounter Vitals Group     BP 02/16/24 0722 131/89     Systolic BP Percentile --      Diastolic BP Percentile --      Pulse Rate 02/16/24 0722 71     Resp 02/16/24 0722 18     Temp 02/16/24 0722 98 F (36.7 C)     Temp Source 02/16/24 0722 Oral     SpO2 02/16/24 0722 99 %     Weight 02/16/24 0721 179 lb (81.2 kg)     Height 02/16/24 0721 5\' 3"  (1.6 m)     Head Circumference --      Peak Flow --      Pain Score 02/16/24 0721 6     Pain Loc --      Pain Education --      Exclude from Growth Chart --     Most recent vital signs: Vitals:   02/16/24 0722  BP: 131/89  Pulse: 71  Resp: 18  Temp: 98 F (36.7 C)  SpO2: 99%    General: Awake, no distress.  CV:  Good peripheral perfusion.  Resp:  Normal effort.  Abd:  No distention.  Other:  Positive Phalen's Test left hand/wrist.   ED Results / Procedures / Treatments   Labs (all labs ordered are listed, but only abnormal results are displayed) Labs Reviewed - No data to display   EKG  Not indicated.   RADIOLOGY  Image and radiology report reviewed and interpreted by me. Radiology report consistent with the same.  Image of the left wrist negative for acute abnormalities.  PROCEDURES:  Critical Care  performed: No  Procedures   MEDICATIONS ORDERED IN ED:  Medications - No data to display   IMPRESSION / MDM / ASSESSMENT AND PLAN / ED COURSE   I have reviewed the triage note.  Differential diagnosis includes, but is not limited to, carpal tunnel syndrome, scaphoid fracture, tendonitis  Patient's presentation is most consistent with acute illness / injury with system symptoms.  38 year old female presents to the emergency department for treatment and evaluation of left hand/wrist pain without specific injury. See HPI.  X-ray of the left wrist is negative for any acute concerns.  Symptoms and exam most consistent with carpal tunnel syndrome.  This was discussed with the patient who states that she already has a brace at home.  She was encouraged to wear the brace while she is at work or doing anything that requires repetitive motion.  She will be placed on meloxicam as well.  She was encouraged to see orthopedics if symptoms or not improving over the week or so.        FINAL CLINICAL IMPRESSION(S) / ED DIAGNOSES  Final diagnoses:  Carpal tunnel syndrome of left wrist     Rx / DC Orders   ED Discharge Orders          Ordered    meloxicam (MOBIC) 15 MG tablet  Daily        02/16/24 0981             Note:  This document was prepared using Dragon voice recognition software and may include unintentional dictation errors.   Chinita Pester, FNP 02/16/24 1017    Jene Every, MD 02/16/24 1043

## 2024-02-16 NOTE — ED Triage Notes (Signed)
 Patient reports her left wrist is swollen and has a burning sensation that has been going on for approx 4 days.  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

## 2024-02-16 NOTE — ED Notes (Signed)
 Pt roomed to ED 54-- c/o left wrist pain/tingling with weakness x 4 days.

## 2024-06-23 ENCOUNTER — Encounter: Payer: Self-pay | Admitting: Emergency Medicine

## 2024-06-23 ENCOUNTER — Other Ambulatory Visit: Payer: Self-pay

## 2024-06-23 ENCOUNTER — Emergency Department
Admission: EM | Admit: 2024-06-23 | Discharge: 2024-06-24 | Discharge status: Against Medical Advice | Payer: MEDICAID | Attending: Emergency Medicine | Admitting: Emergency Medicine

## 2024-06-23 DIAGNOSIS — Z5321 Procedure and treatment not carried out due to patient leaving prior to being seen by health care provider: Secondary | ICD-10-CM | POA: Diagnosis not present

## 2024-06-23 DIAGNOSIS — M546 Pain in thoracic spine: Secondary | ICD-10-CM | POA: Insufficient documentation

## 2024-06-23 NOTE — ED Triage Notes (Signed)
 Pt to ED via POV c/o upper back pain x 1 week. Pt is in NAD.
